# Patient Record
Sex: Female | Born: 1965 | Race: White | Hispanic: No | Marital: Single | Smoking: Never smoker
Health system: Southern US, Community
[De-identification: ages and names within clinical notes are randomized; demographics above are authoritative.]

## PROBLEM LIST (undated history)

## (undated) DIAGNOSIS — K219 Gastro-esophageal reflux disease without esophagitis: Secondary | ICD-10-CM

## (undated) DIAGNOSIS — E039 Hypothyroidism, unspecified: Secondary | ICD-10-CM

## (undated) HISTORY — PX: OVARY BIOPSY: SHX2143

## (undated) HISTORY — PX: TUBAL LIGATION: SHX77

---

## 2005-08-14 ENCOUNTER — Ambulatory Visit: Payer: Self-pay | Admitting: Obstetrics and Gynecology

## 2010-02-22 ENCOUNTER — Ambulatory Visit: Payer: Self-pay | Admitting: Otolaryngology

## 2010-02-23 ENCOUNTER — Emergency Department: Payer: Self-pay | Admitting: Emergency Medicine

## 2010-02-25 ENCOUNTER — Inpatient Hospital Stay: Payer: Self-pay | Admitting: Specialist

## 2010-08-01 ENCOUNTER — Ambulatory Visit: Payer: Self-pay | Admitting: Family Medicine

## 2013-05-27 ENCOUNTER — Ambulatory Visit: Payer: Self-pay | Admitting: Otolaryngology

## 2014-08-17 ENCOUNTER — Other Ambulatory Visit: Payer: Self-pay | Admitting: Physician Assistant

## 2014-08-17 DIAGNOSIS — E041 Nontoxic single thyroid nodule: Secondary | ICD-10-CM

## 2014-08-17 DIAGNOSIS — R1310 Dysphagia, unspecified: Secondary | ICD-10-CM

## 2014-08-20 ENCOUNTER — Ambulatory Visit
Admission: RE | Admit: 2014-08-20 | Discharge: 2014-08-20 | Disposition: A | Discharge status: Home / Self Care | Payer: 59 | Source: Ambulatory Visit | Attending: Physician Assistant | Admitting: Physician Assistant

## 2014-08-20 DIAGNOSIS — R1310 Dysphagia, unspecified: Secondary | ICD-10-CM

## 2014-08-20 DIAGNOSIS — E049 Nontoxic goiter, unspecified: Secondary | ICD-10-CM | POA: Insufficient documentation

## 2014-08-20 DIAGNOSIS — E041 Nontoxic single thyroid nodule: Secondary | ICD-10-CM

## 2014-09-06 ENCOUNTER — Encounter
Admission: RE | Admit: 2014-09-06 | Discharge: 2014-09-06 | Disposition: A | Discharge status: Home / Self Care | Payer: 59 | Source: Ambulatory Visit | Attending: Otolaryngology | Admitting: Otolaryngology

## 2014-09-06 DIAGNOSIS — E041 Nontoxic single thyroid nodule: Secondary | ICD-10-CM | POA: Diagnosis present

## 2014-09-06 DIAGNOSIS — E042 Nontoxic multinodular goiter: Secondary | ICD-10-CM | POA: Diagnosis not present

## 2014-09-06 DIAGNOSIS — H9190 Unspecified hearing loss, unspecified ear: Secondary | ICD-10-CM | POA: Diagnosis not present

## 2014-09-06 DIAGNOSIS — Z880 Allergy status to penicillin: Secondary | ICD-10-CM | POA: Diagnosis not present

## 2014-09-06 DIAGNOSIS — Z882 Allergy status to sulfonamides status: Secondary | ICD-10-CM | POA: Diagnosis not present

## 2014-09-06 HISTORY — DX: Gastro-esophageal reflux disease without esophagitis: K21.9

## 2014-09-06 HISTORY — DX: Hypothyroidism, unspecified: E03.9

## 2014-09-06 NOTE — Patient Instructions (Signed)
  Your procedure is scheduled on:09/09/14  6:00 am Report to Day Surgery.MEDICAL MALL TO SECOND FLOOR To find out your arrival time please call [phone] between 1PM - 3PM on Remember: Instructions that are not followed completely may result in serious medical risk, up to and including death, or upon the discretion of your surgeon and anesthesiologist your surgery may need to be rescheduled.    _x___ 1. Do not eat food or drink liquids after midnight. No gum chewing or hard candies.     __x__ 2. No Alcohol for 24 hours before or after surgery.   ____ 3. Bring all medications with you on the day of surgery if instructed.    __x__ 4. Notify your doctor if there is any change in your medical condition     (cold, fever, infections).     Do not wear jewelry, make-up, hairpins, clips or nail polish.  Do not wear lotions, powders, or perfumes. You may wear deodorant.  Do not shave 48 hours prior to surgery. Men may shave face and neck.  Do not bring valuables to the hospital.    [facility] responsible for any belongings or valuables.               Contacts, dentures or bridgework may not be worn into surgery.  Leave your suitcase in the car. After surgery it may be brought to your room.  For patients admitted to the hospital, discharge time is determined by your                treatment team.   Patients discharged the day of surgery will not be allowed to drive home.   Please read over the following fact sheets that you were given:   Surgical Site Infection Prevention   ____ Take these medicines the morning of surgery with A SIP OF WATER:    1.   2.   3.   4.  5.  6.  ____ Fleet Enema (as directed)   x____ Use CHG Soap as directed  ____ Use inhalers on the day of surgery  ____ Stop metformin 2 days prior to surgery    ____ Take 1/2 of usual insulin dose the night before surgery and none on the morning of surgery.   ____ Stop Coumadin/Plavix/aspirin on   ____  Stop Anti-inflammatories on    ____ Stop supplements until after surgery.    ____ Bring C-Pap to the hospital.

## 2014-09-06 NOTE — OR Nursing (Signed)
Interpreter for deaf requested

## 2014-09-09 ENCOUNTER — Encounter
Admission: AD | Disposition: A | Discharge status: Home / Self Care | Payer: Self-pay | Source: Ambulatory Visit | Attending: Otolaryngology

## 2014-09-09 ENCOUNTER — Ambulatory Visit: Payer: 59 | Admitting: Anesthesiology

## 2014-09-09 ENCOUNTER — Encounter: Payer: Self-pay | Admitting: *Deleted

## 2014-09-09 ENCOUNTER — Observation Stay
Admission: AD | Admit: 2014-09-09 | Discharge: 2014-09-09 | Disposition: A | Discharge status: Home / Self Care | Payer: 59 | Source: Ambulatory Visit | Attending: Otolaryngology | Admitting: Otolaryngology

## 2014-09-09 DIAGNOSIS — Z880 Allergy status to penicillin: Secondary | ICD-10-CM | POA: Insufficient documentation

## 2014-09-09 DIAGNOSIS — H9190 Unspecified hearing loss, unspecified ear: Secondary | ICD-10-CM | POA: Insufficient documentation

## 2014-09-09 DIAGNOSIS — Z882 Allergy status to sulfonamides status: Secondary | ICD-10-CM | POA: Insufficient documentation

## 2014-09-09 DIAGNOSIS — E042 Nontoxic multinodular goiter: Principal | ICD-10-CM | POA: Insufficient documentation

## 2014-09-09 DIAGNOSIS — E890 Postprocedural hypothyroidism: Secondary | ICD-10-CM

## 2014-09-09 HISTORY — PX: THYROIDECTOMY: SHX17

## 2014-09-09 LAB — CALCIUM
CALCIUM: 8.7 mg/dL — AB (ref 8.9–10.3)
Calcium: 8.8 mg/dL — ABNORMAL LOW (ref 8.9–10.3)

## 2014-09-09 LAB — ALBUMIN: ALBUMIN: 3.8 g/dL (ref 3.5–5.0)

## 2014-09-09 SURGERY — THYROIDECTOMY
Anesthesia: General | Wound class: Clean

## 2014-09-09 MED ORDER — PROPOFOL 10 MG/ML IV BOLUS
INTRAVENOUS | Status: DC | PRN
  Administered 2014-09-09: 120 mg via INTRAVENOUS

## 2014-09-09 MED ORDER — ACETAMINOPHEN 10 MG/ML IV SOLN
INTRAVENOUS | Status: DC | PRN
  Administered 2014-09-09: 1000 mg via INTRAVENOUS

## 2014-09-09 MED ORDER — BACITRACIN ZINC 500 UNIT/GM EX OINT
TOPICAL_OINTMENT | CUTANEOUS | Status: AC
  Filled 2014-09-09: qty 0.9

## 2014-09-09 MED ORDER — LIDOCAINE HCL (CARDIAC) 20 MG/ML IV SOLN
INTRAVENOUS | Status: DC | PRN
  Administered 2014-09-09 (×2): 100 mg via INTRAVENOUS

## 2014-09-09 MED ORDER — FENTANYL CITRATE (PF) 100 MCG/2ML IJ SOLN
25.0000 ug | INTRAMUSCULAR | Status: DC | PRN
  Administered 2014-09-09 (×2): 25 ug via INTRAVENOUS

## 2014-09-09 MED ORDER — DEXTROSE-NACL 5-0.45 % IV SOLN
INTRAVENOUS | Status: DC
  Administered 2014-09-09: 11:00:00 via INTRAVENOUS

## 2014-09-09 MED ORDER — ONDANSETRON HCL 4 MG/2ML IJ SOLN
4.0000 mg | Freq: Once | INTRAMUSCULAR | Status: AC | PRN
Start: 2014-09-09 — End: 2014-09-09
  Administered 2014-09-09: 4 mg via INTRAVENOUS

## 2014-09-09 MED ORDER — TRAMADOL HCL 50 MG PO TABS
50.0000 mg | ORAL_TABLET | Freq: Four times a day (QID) | ORAL | Status: DC | PRN
  Administered 2014-09-09: 50 mg via ORAL
  Filled 2014-09-09: qty 1

## 2014-09-09 MED ORDER — SUCCINYLCHOLINE CHLORIDE 20 MG/ML IJ SOLN
INTRAMUSCULAR | Status: DC | PRN
  Administered 2014-09-09: 100 mg via INTRAVENOUS

## 2014-09-09 MED ORDER — SODIUM CHLORIDE 0.9 % IR SOLN
Status: DC | PRN
  Administered 2014-09-09: 200 mL

## 2014-09-09 MED ORDER — ACETAMINOPHEN 10 MG/ML IV SOLN
INTRAVENOUS | Status: AC
Start: 2014-09-09 — End: 2014-09-09
  Filled 2014-09-09: qty 100

## 2014-09-09 MED ORDER — FENTANYL CITRATE (PF) 100 MCG/2ML IJ SOLN
INTRAMUSCULAR | Status: AC
  Administered 2014-09-09: 25 ug via INTRAVENOUS
  Filled 2014-09-09: qty 2

## 2014-09-09 MED ORDER — LEVOTHYROXINE SODIUM 100 MCG PO TABS: 100.0000 ug | ORAL_TABLET | Freq: Every day | ORAL | Status: AC

## 2014-09-09 MED ORDER — CALCIUM CARBONATE-VITAMIN D 500-200 MG-UNIT PO TABS
2.0000 | ORAL_TABLET | Freq: Three times a day (TID) | ORAL | Status: DC
  Administered 2014-09-09 (×2): 2 via ORAL
  Filled 2014-09-09 (×2): qty 2

## 2014-09-09 MED ORDER — CALCIUM CARBONATE-VITAMIN D 500-200 MG-UNIT PO TABS: 2.0000 | ORAL_TABLET | Freq: Three times a day (TID) | ORAL | Status: DC

## 2014-09-09 MED ORDER — MIDAZOLAM HCL 2 MG/2ML IJ SOLN
INTRAMUSCULAR | Status: DC | PRN
  Administered 2014-09-09: 1 mg via INTRAVENOUS

## 2014-09-09 MED ORDER — ONDANSETRON HCL 4 MG/2ML IJ SOLN: 4.0000 mg | Freq: Four times a day (QID) | INTRAMUSCULAR | Status: DC | PRN

## 2014-09-09 MED ORDER — PROMETHAZINE HCL 12.5 MG PO TABS: 12.5000 mg | ORAL_TABLET | Freq: Four times a day (QID) | ORAL | Status: DC | PRN

## 2014-09-09 MED ORDER — DEXAMETHASONE SODIUM PHOSPHATE 4 MG/ML IJ SOLN
INTRAMUSCULAR | Status: DC | PRN
  Administered 2014-09-09: 10 mg via INTRAVENOUS

## 2014-09-09 MED ORDER — FAMOTIDINE 20 MG PO TABS
20.0000 mg | ORAL_TABLET | Freq: Once | ORAL | Status: AC
  Administered 2014-09-09: 20 mg via ORAL

## 2014-09-09 MED ORDER — PROMETHAZINE HCL 25 MG/ML IJ SOLN
INTRAMUSCULAR | Status: AC
  Administered 2014-09-09: 6.25 mg via INTRAVENOUS
  Filled 2014-09-09: qty 1

## 2014-09-09 MED ORDER — ACETAMINOPHEN 325 MG PO TABS
650.0000 mg | ORAL_TABLET | ORAL | Status: DC | PRN
  Administered 2014-09-09: 650 mg via ORAL
  Filled 2014-09-09: qty 2

## 2014-09-09 MED ORDER — BACITRACIN ZINC 500 UNIT/GM EX OINT
TOPICAL_OINTMENT | CUTANEOUS | Status: DC | PRN
  Administered 2014-09-09: 1 via TOPICAL

## 2014-09-09 MED ORDER — SODIUM CHLORIDE 0.9 % IJ SOLN
INTRAMUSCULAR | Status: AC
  Filled 2014-09-09: qty 10

## 2014-09-09 MED ORDER — ONDANSETRON HCL 4 MG/2ML IJ SOLN
INTRAMUSCULAR | Status: AC
  Administered 2014-09-09: 4 mg via INTRAVENOUS
  Filled 2014-09-09: qty 2

## 2014-09-09 MED ORDER — HYDROMORPHONE HCL 1 MG/ML IJ SOLN: 0.2500 mg | INTRAMUSCULAR | Status: DC | PRN

## 2014-09-09 MED ORDER — LEVOTHYROXINE SODIUM 50 MCG PO TABS: 100.0000 ug | ORAL_TABLET | Freq: Every day | ORAL | Status: DC

## 2014-09-09 MED ORDER — TRAMADOL HCL 50 MG PO TABS: 50.0000 mg | ORAL_TABLET | Freq: Four times a day (QID) | ORAL | Status: DC | PRN

## 2014-09-09 MED ORDER — FAMOTIDINE 20 MG PO TABS
ORAL_TABLET | ORAL | Status: AC
  Administered 2014-09-09: 20 mg via ORAL
  Filled 2014-09-09: qty 1

## 2014-09-09 MED ORDER — BUPIVACAINE-EPINEPHRINE (PF) 0.25% -1:200000 IJ SOLN
INTRAMUSCULAR | Status: AC
  Filled 2014-09-09: qty 30

## 2014-09-09 MED ORDER — LACTATED RINGERS IV SOLN
INTRAVENOUS | Status: DC
  Administered 2014-09-09: 75 mL/h via INTRAVENOUS

## 2014-09-09 MED ORDER — BUPIVACAINE-EPINEPHRINE (PF) 0.25% -1:200000 IJ SOLN
INTRAMUSCULAR | Status: DC | PRN
  Administered 2014-09-09: 6.5 mL via PERINEURAL

## 2014-09-09 MED ORDER — PROMETHAZINE HCL 25 MG/ML IJ SOLN
6.2500 mg | Freq: Once | INTRAMUSCULAR | Status: AC
  Administered 2014-09-09: 6.25 mg via INTRAVENOUS

## 2014-09-09 MED ORDER — ONDANSETRON HCL 4 MG/2ML IJ SOLN
INTRAMUSCULAR | Status: DC | PRN
  Administered 2014-09-09: 4 mg via INTRAVENOUS

## 2014-09-09 MED ORDER — HYDROMORPHONE HCL 1 MG/ML IJ SOLN
INTRAMUSCULAR | Status: DC | PRN
  Administered 2014-09-09: 1 mg via INTRAVENOUS

## 2014-09-09 MED ORDER — REMIFENTANIL HCL 1 MG IV SOLR
INTRAVENOUS | Status: DC | PRN
  Administered 2014-09-09: .08 ug/kg/min via INTRAVENOUS

## 2014-09-09 SURGICAL SUPPLY — 37 items
BLADE SURG 15 STRL LF DISP TIS (BLADE) ×1 IMPLANT
BLADE SURG 15 STRL SS (BLADE) ×2
CANISTER SUCT 1200ML W/VALVE (MISCELLANEOUS) ×3 IMPLANT
CLOSURE WOUND 1/4X4 (GAUZE/BANDAGES/DRESSINGS) ×1
CORD BIP STRL DISP 12FT (MISCELLANEOUS) ×3 IMPLANT
DECANTER SPIKE VIAL GLASS SM (MISCELLANEOUS) ×3 IMPLANT
DRAIN TLS ROUND 10FR (DRAIN) IMPLANT
DRAPE MAG INST 16X20 L/F (DRAPES) ×3 IMPLANT
DRSG TEGADERM 2-3/8X2-3/4 SM (GAUZE/BANDAGES/DRESSINGS) ×6 IMPLANT
ELECT LARYNGEAL 6/7 (MISCELLANEOUS) ×3
ELECT LARYNGEAL 8/9 (MISCELLANEOUS)
ELECTRODE LARYNGEAL 6/7 (MISCELLANEOUS) ×1 IMPLANT
ELECTRODE LARYNGEAL 8/9 (MISCELLANEOUS) IMPLANT
FORCEPS JEWEL BIP 4-3/4 STR (INSTRUMENTS) ×3 IMPLANT
GLOVE BIO SURGEON STRL SZ7.5 (GLOVE) ×18 IMPLANT
GOWN STRL REUS W/ TWL LRG LVL3 (GOWN DISPOSABLE) ×4 IMPLANT
GOWN STRL REUS W/TWL LRG LVL3 (GOWN DISPOSABLE) ×8
HARMONIC SCALPEL FOCUS (MISCELLANEOUS) ×3 IMPLANT
HEMOSTAT SURGICEL 2X3 (HEMOSTASIS) ×3 IMPLANT
HOOK STAY 5M SHARP BLUNT 3316- (MISCELLANEOUS) IMPLANT
KIT RM TURNOVER STRD PROC AR (KITS) ×3 IMPLANT
LABEL OR SOLS (LABEL) ×3 IMPLANT
LIQUID BAND (GAUZE/BANDAGES/DRESSINGS) IMPLANT
NS IRRIG 500ML POUR BTL (IV SOLUTION) ×3 IMPLANT
PACK HEAD/NECK (MISCELLANEOUS) ×3 IMPLANT
PAD GROUND ADULT SPLIT (MISCELLANEOUS) ×3 IMPLANT
PROBE NEUROSIGN BIPOL (MISCELLANEOUS) ×1 IMPLANT
PROBE NEUROSIGN BIPOLAR (MISCELLANEOUS) ×2
SPONGE KITTNER 5P (MISCELLANEOUS) ×9 IMPLANT
SPONGE XRAY 4X4 16PLY STRL (MISCELLANEOUS) ×3 IMPLANT
STRIP CLOSURE SKIN 1/4X4 (GAUZE/BANDAGES/DRESSINGS) ×2 IMPLANT
SUT PROLENE 6 0 P 1 18 (SUTURE) ×6 IMPLANT
SUT SILK 2 0 (SUTURE)
SUT SILK 2 0 SH (SUTURE) IMPLANT
SUT SILK 2-0 18XBRD TIE 12 (SUTURE) IMPLANT
SUT VIC AB 4-0 RB1 18 (SUTURE) ×3 IMPLANT
SYSTEM CHEST DRAIN TLS 7FR (DRAIN) ×3 IMPLANT

## 2014-09-09 NOTE — H&P (Signed)
..  History and Physical paper copy reviewed and updated date of procedure and will be scanned into system.  

## 2014-09-09 NOTE — Anesthesia Preprocedure Evaluation (Addendum)
Anesthesia Evaluation  Patient identified by MRN, date of birth, ID band Patient awake    Reviewed: Allergy & Precautions, NPO status , Patient's Chart, lab work & pertinent test results  History of Anesthesia Complications Negative for: history of anesthetic complications  Airway Mallampati: II  TM Distance: >3 FB Neck ROM: Full    Dental no notable dental hx.    Pulmonary neg pulmonary ROS,  breath sounds clear to auscultation  Pulmonary exam normal       Cardiovascular Exercise Tolerance: Good negative cardio ROS Normal cardiovascular examRhythm:Regular Rate:Normal     Neuro/Psych deaf negative psych ROS   GI/Hepatic Neg liver ROS, GERD-  Medicated and Poorly Controlled,  Endo/Other  Hypothyroidism   Renal/GU negative Renal ROS  negative genitourinary   Musculoskeletal negative musculoskeletal ROS (+)   Abdominal   Peds negative pediatric ROS (+)  Hematology negative hematology ROS (+)   Anesthesia Other Findings   Reproductive/Obstetrics negative OB ROS                            Anesthesia Physical Anesthesia Plan  ASA: II  Anesthesia Plan: General   Post-op Pain Management:    Induction: Intravenous  Airway Management Planned: Oral ETT  Additional Equipment:   Intra-op Plan:   Post-operative Plan: Extubation in OR  Informed Consent: I have reviewed the patients History and Physical, chart, labs and discussed the procedure including the risks, benefits and alternatives for the proposed anesthesia with the patient or authorized representative who has indicated his/her understanding and acceptance.   Dental advisory given  Plan Discussed with: Surgeon and CRNA  Anesthesia Plan Comments:         Anesthesia Quick Evaluation

## 2014-09-09 NOTE — Transfer of Care (Signed)
Immediate Anesthesia Transfer of Care Note  Patient: Dawn BaltimoreCatherine L Frontera  Procedure(s) Performed: Procedure(s): TOTAL THYROIDECTOMY, left superior parathyroid gland reimplanted (N/A)  Patient Location: PACU  Anesthesia Type:General  Level of Consciousness: awake, alert , oriented and patient cooperative  Airway & Oxygen Therapy: Patient Spontanous Breathing and Patient connected to nasal cannula oxygen  Post-op Assessment: Report given to RN and Post -op Vital signs reviewed and stable  Post vital signs: Reviewed and stable  Last Vitals:  Filed Vitals:   09/09/14 1014  BP: 121/71  Pulse:   Temp: 37.6 C  Resp: 20    Complications: No apparent anesthesia complications

## 2014-09-09 NOTE — Progress Notes (Signed)
..   09/09/2014 5:23 PM  Shirley FriarMason, Jamyrah [id number]  Post-Op Day 0    Temp:  [97.5 F (36.4 C)-99.7 F (37.6 C)] 97.7 F (36.5 C) (06/30 1551) Pulse Rate:  [58-116] 58 (06/30 1551) Resp:  [12-20] 16 (06/30 1551) BP: (97-127)/(54-77) 97/54 mmHg (06/30 1551) SpO2:  [93 %-100 %] 98 % (06/30 1551) Weight:  [75.297 kg (166 lb)] 75.297 kg (166 lb) (06/30 [id number]),     Intake/Output Summary (Last 24 hours) at 09/09/14 1723 Last data filed at 09/09/14 1551  Gross per 24 hour  Intake   1240 ml  Output     10 ml  Net   1230 ml    Results for orders placed or performed during the hospital encounter of 09/09/14 (from the past 24 hour(s))  Calcium     Status: Abnormal   Collection Time: 09/09/14 10:54 AM  Result Value Ref Range   Calcium 8.8 (L) 8.9 - 10.3 mg/dL  Albumin     Status: None   Collection Time: 09/09/14 10:54 AM  Result Value Ref Range   Albumin 3.8 3.5 - 5.0 g/dL  Calcium     Status: Abnormal   Collection Time: 09/09/14  2:54 PM  Result Value Ref Range   Calcium 8.7 (L) 8.9 - 10.3 mg/dL    SUBJECTIVE:  No acute events.  Reports able to tolerate diet.  No numbness or tingling.  Expected sore throat.  No respiratory issues.  OBJECTIVE:   Gen- NAD, alert and oriented Ears- hard of hearing at baseline, normal external ears Nose- clear anteriorly, nasal cannula removed Oc/op- neg Neck- incision c/d/i, drain in place, no hematoma or seroma Neuro- neg Chovstek's  IMPRESSION:  S/p Total thyroidectomy  PLAN:  Calciums stable and correct with albumin to normal range.  Patient's pain controlled and ambulating and tolerating PO.  Will discharge home and see back in my office tomorrow morning at 8:15.  Carlena Ruybal 09/09/2014, 5:23 PM

## 2014-09-09 NOTE — Anesthesia Postprocedure Evaluation (Signed)
  Anesthesia Post-op Note  Patient: Dawn Randall  Procedure(s) Performed: Procedure(s): TOTAL THYROIDECTOMY, left superior parathyroid gland reimplanted (N/A)  Anesthesia type:General  Patient location: PACU  Post pain: Pain level controlled  Post assessment: Post-op Vital signs reviewed, Patient's Cardiovascular Status Stable, Respiratory Function Stable, Patent Airway and No signs of Nausea or vomiting  Post vital signs: Reviewed and stable  Last Vitals:  Filed Vitals:   09/09/14 1045  BP: 126/71  Pulse: 88  Temp:   Resp: 20    Level of consciousness: awake, alert  and patient cooperative  Complications: No apparent anesthesia complications

## 2014-09-09 NOTE — Op Note (Addendum)
..09/09/2014  10:10 AM    Shirley Friar  [id number]   Pre-Op Dx: THYROID NODULES  Post-op Dx: SAME  Pre-Op Dx: Thyroid Nodules increasing in size  Post-op Dx: same  Proc: Minimally Invasive Total Thyroidectomy with Laryngeal Nerve Monitoring   Surg: Lalanya Rufener   Assistant: Marion Downer  Anes: GOT  EBL: 10 cc  Comp: none  Findings: Right superior and inferior parathyroid glands identified and preserved, left superior parathyroid identified and reimplanted, Left inferior parathyroid gland identified and preserved. Bilateral Recurrent laryngeal nerves identified and preserved. Multinodular thyroid.  Procedure: After the patient was identified in holding and the consent and H&P was reviewed, the patient was brought to the operating room and place in a supine position. At this time, the patient was marked along a natural skin crease in the lower neck after general endotracheal anesthesia was induced. Visualization of the nerve monitoring electrode on the endotracheal tube was made with a Glyde Laryngoscope. The patient at this time was injected with 8ccs of 0.25% Marcaine with 1:200,000 epinephrine. At this time, the patient was prepped and draped in a sterile fashion.  An anterior neck incision with a 15 blade was made and dissection through the subcutaneous tissues was preformed with Bovie electrocautery. The platysma was divided and the median raphe was sharply and bluntly dissected until the anterior isthmus of the thyroid was encountered. At this time, the median raphe was opened until the sternal notch to the thyroid notch. Anterior neck veins were divided with Harmonic Scalpel.   Attention at this time was directed to the patient's left thyroid lobe. The sternohyoid and sternothyroid muscles were separated from the left thyroid lobe until the carotid artery and sheath were encountered. The lateral border of the thyroid was bluntly and sharply  dissected inferior and superior. The lateral border of the superior thyroid vessels were identified and pedicled. Medially between the larynx and the superior thyroid lobe, Joel's space as bluntly entered resulting in a pedicled superior thyroid vessel bundle. This was ligated With Harmonic scalpel. At this time, the superior attachments were divided and the thyroid lobe was able to be brought out of the wound and Berry's ligament was pedicled. Careful dissection revealed the superior and inferior parathyroid glands as well as the recurrent laryngeal nerve coursing deep to the inferior thyroid artery. This was identified and protected and the remaining attachments of the left hemithyroid were divided.  Attention at this time was directed to the patient's right thyroid lobe. The sternohyoid and sternothyroid muscles were separated from the right thyroid lobe until the carotid artery and sheath were encountered. The lateral border of the thyroid was bluntly and sharply dissected inferior and superior. This demonstrated multiple lymph nodes medially in the area of the pyramidal lobe. The lateral border of the superior thyroid vessels were identified and pedicled. Medially between the larynx and the superior thyroid lobe, Joel's space as bluntly entered resulting in a pedicled superior thyroid vessel bundle. This was ligated With Harmonic scalpel. At this time, the superior attachments were divided and the thyroid lobe was able to be brought out of the wound and Berry's ligament was pedicled. Careful dissection revealed the superior and inferior parathyroid glands as well as the recurrent laryngeal nerve coursing deep to the inferior thyroid artery. This was identified and protected and the remaining attachments of the right hemithyroid were divided.  At this time, all remaining attachments of the thyroid gland to the anterior trachea were divided.  A large pyramidal lobe was dissected  away from  the midline larynx.   Both recurrent laryngeal nerves were noted to be intact and stimulated well. The left parathyroid glands were both evaluated and the inferior was noted to be of good color, but the superior parathyroid gland was dusky and thus was minced and reimplanted into the patient's left strap muscle and marked with a prolene stitch. The right superior and inferior parathyroid gland was identified and noted to be of normal color. No parathyroid gland was noted in the specimen.  At this time the patient's neck was copiously irrigated with sterile saline.  Meticulous hemostasis was ensured.  Surgicele was placed with in the thyroid bed bilaterally.  A small TLS drain was place with in the thyroid beds and taken out of the left neck adjacent to the incision.  The strap muscles were reapproximated using a sinlge 4.0 vicryl with a figure of 8 stitch.  The sub dermis was reapproximated using subdermal 4.0 vicryls and then the skin was closed with 6.0 prolene in a locking-running fashion and topped with a bacitracin.  At this time, care of the patient was transferred to anesthesia were the patient was taken to PACU in good condition.   Dispo: To PACU in good condition  Plan: Admit to floor. Follow calcium levels. Begin synthroid.  Avarey Yaeger Floyd Lusignan  09/09/2014 10:10 AM

## 2014-09-15 LAB — SURGICAL PATHOLOGY

## 2014-12-30 ENCOUNTER — Encounter: Payer: Self-pay | Admitting: Orthopedic Surgery

## 2014-12-30 ENCOUNTER — Ambulatory Visit (INDEPENDENT_AMBULATORY_CARE_PROVIDER_SITE_OTHER): Payer: 59

## 2014-12-30 ENCOUNTER — Ambulatory Visit (INDEPENDENT_AMBULATORY_CARE_PROVIDER_SITE_OTHER): Payer: 59 | Admitting: Orthopedic Surgery

## 2014-12-30 VITALS — BP 124/77 | Ht 64.0 in | Wt 154.0 lb

## 2014-12-30 DIAGNOSIS — M75102 Unspecified rotator cuff tear or rupture of left shoulder, not specified as traumatic: Secondary | ICD-10-CM | POA: Diagnosis not present

## 2014-12-30 DIAGNOSIS — M25512 Pain in left shoulder: Secondary | ICD-10-CM

## 2014-12-30 NOTE — Patient Instructions (Signed)
We will schedule MRI for you and call your daughter with the appt and follow up appt

## 2014-12-30 NOTE — Progress Notes (Signed)
Patient ID: Dawn Randall, female   DOB: 04/28/1965, 49 y.o.   MRN: [id number]  Chief Complaint  Patient presents with  . Shoulder Pain    left shoulder pain x 6 months    HPI Dawn Randall is a 49 y.o. female.   She presents for evaluation of 2 shoulder injuries in the last 6 months. She was remodeling her house reached above her head felt a significant pain in the left deltoid area which lasted for several weeks. That injury was in April 2016 and then in September she reached above her head again felt more intense pain and since that time she has not been able to lift her arm over her head or reach behind her back without severe pain in fact she can only lift her arm or proximally 90 in the scapular plane.  Her pain is now constant 6 out of 10 and is best described as a sharp throbbing aching sensation in the left shoulder. This is associated with swelling stiffness and occasional numbness and tingling. She's been on naproxen since April without relief   Review of systems tingling in the left shoulder the remaining 13 systems were reviewed and were normal  Review of Systems Review of Systems  Past Medical History  Diagnosis Date  . GERD (gastroesophageal reflux disease)   . Hypothyroidism     Past Surgical History  Procedure Laterality Date  . Cesarean section    . Tubal ligation    . Ovary biopsy    . Thyroidectomy N/A 09/09/2014    Procedure: TOTAL THYROIDECTOMY, left superior parathyroid gland reimplanted;  Surgeon: Bud Face, MD;  Location: [facility] ORS;  Service: ENT;  Laterality: N/A;    No family history on file.  Social History Social History  Substance Use Topics  . Smoking status: Never Smoker   . Smokeless tobacco: Never Used  . Alcohol Use: Yes    Allergies  Allergen Reactions  . Sulfa Antibiotics Anaphylaxis  . Adhesive [Tape] Hives  . Codeine Hives  . Morphine And Related Nausea And Vomiting  . Penicillins Hives    Current Outpatient  Prescriptions  Medication Sig Dispense Refill  . calcium-vitamin D (OSCAL WITH D) 500-200 MG-UNIT per tablet Take 2 tablets by mouth 3 (three) times daily. For 1 week.  Then take 2 tablets by mouth 2 times a day for 1 week, then take 2 tablets by mouth one time a day for 1 week. 120 tablet 0  . levothyroxine (SYNTHROID, LEVOTHROID) 100 MCG tablet Take 1 tablet (100 mcg total) by mouth daily before breakfast. 30 tablet 11   No current facility-administered medications for this visit.       Physical Exam Physical Exam Blood pressure 124/77, height  (1.626 m), weight 154 lb (69.854 kg). Patient is awake alert and oriented 3. She is definitely have an interpreter. Her mood is pleasant. Her general appearance is normal. Exam is without assistive devices. She's neurovascularly intact in the left upper extremity skin of the left arm is normal she has no axillary lymph nodes and or clavicular lymph nodes are negative as well  We noticed normal right shoulder range of motion stability and strength and is no tenderness or swelling Left Shoulder Exam   Tenderness  The patient is experiencing tenderness in the acromion and deltoid and rotator interval .  Range of Motion  Active Abduction:  70 Passive Abduction:                    100 Extension:                                  30 External Rotation:                      40  Muscle Strength  Abduction:            3/5 External Rotation: 5/5 Supraspinatus:     3/5 Subscapularis:     5/5 Biceps:                 5/5  Tests  Impingement:   Positive Hawkins:          Positive Cross Arm:      Positive Drop Arm:        Positive Apprehension: Negative Sulcus:            Negative    I ordered plain film she had a type I acromion and a normal glenohumeral joint  Impression torn rotator cuff left shoulder  Recommend MRI to prepare for surgical intervention patient aware.

## 2015-01-05 ENCOUNTER — Ambulatory Visit (HOSPITAL_COMMUNITY)
Admission: RE | Admit: 2015-01-05 | Discharge: 2015-01-05 | Disposition: A | Discharge status: Home / Self Care | Payer: 59 | Source: Ambulatory Visit | Attending: Orthopedic Surgery | Admitting: Orthopedic Surgery

## 2015-01-05 DIAGNOSIS — M25512 Pain in left shoulder: Secondary | ICD-10-CM | POA: Insufficient documentation

## 2015-01-05 DIAGNOSIS — R937 Abnormal findings on diagnostic imaging of other parts of musculoskeletal system: Secondary | ICD-10-CM | POA: Diagnosis not present

## 2015-01-05 DIAGNOSIS — M75102 Unspecified rotator cuff tear or rupture of left shoulder, not specified as traumatic: Secondary | ICD-10-CM

## 2015-01-05 DIAGNOSIS — M25612 Stiffness of left shoulder, not elsewhere classified: Secondary | ICD-10-CM | POA: Insufficient documentation

## 2015-01-10 ENCOUNTER — Telehealth: Payer: Self-pay | Admitting: Orthopedic Surgery

## 2015-01-10 NOTE — Telephone Encounter (Signed)
MRI in 2016 October looking for rotator cuff tear  Looks like adhesive capsulitis  Recommend physical therapy  Message left with Dawn [name] who is on the contact list to call the office back to acknowledge message received and SO THAT we can order therapy for her.   IMPRESSION: 1. Intermediate signal material effacing the normal subcoracoid fat as can be seen with adhesive capsulitis. Mild increased signal in the posterior band of the inferior glenohumeral ligament which is attenuated at the humeral insertion concerning for remote injury. 2. No rotator cuff tear or labral tear of the left shoulder.     Electronically Signed   By: Elige KoHetal  Patel   On: 01/06/2015 08:32

## 2015-01-11 ENCOUNTER — Telehealth: Payer: Self-pay | Admitting: Orthopedic Surgery

## 2015-01-11 NOTE — Telephone Encounter (Signed)
Routing to Dr Harrison for review 

## 2015-01-11 NOTE — Telephone Encounter (Signed)
Patient (daughter,designated contact) called back in response to receiving Dr Mort SawyersHarrison's message about results of mother's MRI.  States physical therapy is indicated, and if so, prefers [place] location, [facility].  Please call and advise at her cell #[phone].  Patient also has MyChart account.

## 2015-01-12 ENCOUNTER — Other Ambulatory Visit: Payer: Self-pay | Admitting: *Deleted

## 2015-01-12 DIAGNOSIS — M75102 Unspecified rotator cuff tear or rupture of left shoulder, not specified as traumatic: Secondary | ICD-10-CM

## 2015-01-12 NOTE — Telephone Encounter (Signed)
SCHEDULE PT  READ PROGRESS NOTE FOR DX   3 X A WEEK X 6 WEEKS

## 2015-01-12 NOTE — Telephone Encounter (Signed)
Order sent as requested

## 2015-01-21 ENCOUNTER — Other Ambulatory Visit (HOSPITAL_COMMUNITY): Payer: 59

## 2020-04-29 ENCOUNTER — Inpatient Hospital Stay
Admission: RE | Admit: 2020-04-29 | Discharge: 2020-04-29 | Disposition: A | Discharge status: Home / Self Care | Payer: Self-pay | Source: Ambulatory Visit | Attending: *Deleted | Admitting: *Deleted

## 2020-04-29 ENCOUNTER — Other Ambulatory Visit: Payer: Self-pay | Admitting: *Deleted

## 2020-04-29 DIAGNOSIS — Z1231 Encounter for screening mammogram for malignant neoplasm of breast: Secondary | ICD-10-CM

## 2020-05-04 ENCOUNTER — Other Ambulatory Visit: Payer: Self-pay | Admitting: Family Medicine

## 2020-05-04 DIAGNOSIS — N6310 Unspecified lump in the right breast, unspecified quadrant: Secondary | ICD-10-CM

## 2020-05-13 ENCOUNTER — Other Ambulatory Visit: Payer: Self-pay

## 2020-05-13 ENCOUNTER — Ambulatory Visit
Admission: RE | Admit: 2020-05-13 | Discharge: 2020-05-13 | Disposition: A | Discharge status: Home / Self Care | Payer: 59 | Source: Ambulatory Visit | Attending: Family Medicine | Admitting: Family Medicine

## 2020-05-13 DIAGNOSIS — N6310 Unspecified lump in the right breast, unspecified quadrant: Secondary | ICD-10-CM | POA: Insufficient documentation

## 2021-05-26 ENCOUNTER — Other Ambulatory Visit: Payer: Self-pay | Admitting: Physician Assistant

## 2021-05-26 DIAGNOSIS — R200 Anesthesia of skin: Secondary | ICD-10-CM

## 2021-05-26 DIAGNOSIS — R202 Paresthesia of skin: Secondary | ICD-10-CM

## 2021-06-06 ENCOUNTER — Other Ambulatory Visit: Payer: Self-pay

## 2021-06-06 ENCOUNTER — Ambulatory Visit
Admission: RE | Admit: 2021-06-06 | Discharge: 2021-06-06 | Disposition: A | Discharge status: Home / Self Care | Payer: 59 | Source: Ambulatory Visit | Attending: Physician Assistant | Admitting: Physician Assistant

## 2021-06-06 DIAGNOSIS — R202 Paresthesia of skin: Secondary | ICD-10-CM

## 2021-06-06 DIAGNOSIS — R200 Anesthesia of skin: Secondary | ICD-10-CM

## 2021-06-06 MED ORDER — GADOBENATE DIMEGLUMINE 529 MG/ML IV SOLN
14.0000 mL | Freq: Once | INTRAVENOUS | Status: AC | PRN
  Administered 2021-06-06: 14 mL via INTRAVENOUS

## 2021-07-18 ENCOUNTER — Other Ambulatory Visit: Payer: Self-pay | Admitting: Physician Assistant

## 2021-07-18 DIAGNOSIS — R200 Anesthesia of skin: Secondary | ICD-10-CM

## 2021-07-18 DIAGNOSIS — G8929 Other chronic pain: Secondary | ICD-10-CM

## 2021-09-01 ENCOUNTER — Other Ambulatory Visit: Payer: Self-pay | Admitting: Orthopedic Surgery

## 2021-09-01 DIAGNOSIS — G8929 Other chronic pain: Secondary | ICD-10-CM

## 2021-09-14 ENCOUNTER — Ambulatory Visit
Admission: RE | Admit: 2021-09-14 | Discharge: 2021-09-14 | Disposition: A | Discharge status: Home / Self Care | Payer: 59 | Source: Ambulatory Visit | Attending: Orthopedic Surgery | Admitting: Orthopedic Surgery

## 2021-09-14 DIAGNOSIS — G8929 Other chronic pain: Secondary | ICD-10-CM | POA: Diagnosis present

## 2021-09-14 DIAGNOSIS — M25561 Pain in right knee: Secondary | ICD-10-CM | POA: Diagnosis present

## 2021-09-20 NOTE — Progress Notes (Unsigned)
Referring Physician:  No referring provider defined for this encounter.  Primary Physician:  Phyllis Ginger (Inactive)  History of Present Illness: 09/21/2021 [name] is here today with a chief complaint of pain in her right buttock and down the back of her right leg. She also reports numbness in her right calf and heel. She has noticed constipation and decreased urination for the past year.  She has been having worsening symptoms over the past 3 years.  She describes pain on her right leg as well as some numbness.  She has weakness in her right leg.  She has fallen.    Conservative measures:  Physical therapy: has not participated Multimodal medical therapy including regular antiinflammatories: lyrica, gabapentin, nortriptyline  Injections: has not received epidural steroid injections  Past Surgery: denies  Dawn Randall has no symptoms of cervical myelopathy.  The symptoms are causing a significant impact on the patient's life.   Review of Systems:  A 10 point review of systems is negative, except for the pertinent positives and negatives detailed in the HPI.  Past Medical History: Past Medical History:  Diagnosis Date   GERD (gastroesophageal reflux disease)    Hypothyroidism     Past Surgical History: Past Surgical History:  Procedure Laterality Date   CESAREAN SECTION     OVARY BIOPSY     THYROIDECTOMY N/A 09/09/2014   Procedure: TOTAL THYROIDECTOMY, left superior parathyroid gland reimplanted;  Surgeon: Bud Face, MD;  Location: [facility] ORS;  Service: ENT;  Laterality: N/A;   TUBAL LIGATION      Allergies: Allergies as of 09/21/2021 - Review Complete 12/30/2014  Allergen Reaction Noted   Sulfa antibiotics Anaphylaxis, Other (See Comments), and Shortness Of Breath 03/29/2010   Tape Hives, Rash, and Other (See Comments) 03/29/2010   Codeine Hives and Itching 03/29/2010   Cyclobenzaprine Other (See Comments) 05/03/2021   Morphine  Itching 03/29/2010   Morphine and related Nausea And Vomiting 09/06/2014   Penicillins Hives and Other (See Comments) 03/29/2010   Zoster vaccine live Other (See Comments) 05/03/2021    Medications: Current Meds  Medication Sig   pregabalin (LYRICA) 50 MG capsule Take 100 mg by mouth at bedtime.    Social History: Social History   Tobacco Use   Smoking status: Never   Smokeless tobacco: Never  Substance Use Topics   Alcohol use: Yes   Drug use: No    Family Medical History: No family history on file.  Physical Examination: Vitals:   09/21/21 1352  BP: 120/70  Pulse: 88    General: Patient is well developed, well nourished, calm, collected, and in no apparent distress. Attention to examination is appropriate.  Psychiatric: Patient is non-anxious.  Head:  Pupils equal, round, and reactive to light.  ENT:  Oral mucosa appears well hydrated.  Neck:   Supple.  Full range of motion.  Respiratory: Patient is breathing without any difficulty.  Extremities: No edema.  Vascular: Palpable dorsal pedal pulses.  Skin:   On exposed skin, there are no abnormal skin lesions.  NEUROLOGICAL:     Awake, alert, oriented to person, place, and time.  Speech is understandable but abnormal due to deafness. Fund of knowledge is appropriate.   Cranial Nerves: Pupils equal round and reactive to light.  Facial tone is symmetric.  Facial sensation is symmetric. Shoulder shrug is symmetric. Tongue protrusion is midline.  There is no pronator drift.   Strength: Side Biceps Triceps Deltoid Interossei Grip Wrist Ext. Wrist  Flex.  R 5 5 5 5 5 5 5   L 5 5 5 5 5 5 5    Side Iliopsoas Quads Hamstring PF DF EHL  R 4 4 4  4+ 4+ 4+  L 5 5 5 5 5 5    Reflexes are 1+ and symmetric at the biceps, triceps, brachioradialis, patella and achilles.   Hoffman's is absent.  Clonus is not present.  Toes are down-going.  Bilateral upper and lower extremity sensation is intact to light touch.    No  evidence of dysmetria noted.  Gait is abnormal due to RLE weakness.      Medical Decision Making  Imaging: MRI of the lumbar spine on Aug 02, 2021 shows 2.1 x 0.6 x 0.5 mm lesion along the right aspect of the conus medullaris suggestive of a lipoma.  I have personally reviewed the images and agree with the above interpretation.  Assessment and Plan: [name] is a pleasant 56 y.o. 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  She does have some right lower extremity weakness which could be due to this.  I described to her and her daughter that we are unable to take care of this lesion at [facility], so I would refer her to the local tertiary care center for evaluation and treatment.   I spent a total of 30 minutes in face-to-face and non-face-to-face activities related to this patient's care today.  Thank you for involving me in the care of this patient.   This note was partially dictated using voice recognition software, so please excuse any errors that were not corrected.   Hasset Chaviano K. Aug 04, 2021 MD, [facility]

## 2021-09-21 ENCOUNTER — Ambulatory Visit: Payer: 59 | Admitting: Neurosurgery

## 2021-09-21 ENCOUNTER — Encounter: Payer: Self-pay | Admitting: Neurosurgery

## 2021-09-21 VITALS — BP 120/70 | HR 88 | Ht 64.0 in | Wt 142.2 lb

## 2021-09-21 DIAGNOSIS — M898X8 Other specified disorders of bone, other site: Secondary | ICD-10-CM

## 2022-01-22 ENCOUNTER — Ambulatory Visit: Payer: 59 | Admitting: Certified Registered"

## 2022-01-22 ENCOUNTER — Ambulatory Visit
Admission: RE | Admit: 2022-01-22 | Discharge: 2022-01-22 | Disposition: A | Discharge status: Home / Self Care | Payer: 59 | Attending: Gastroenterology | Admitting: Gastroenterology

## 2022-01-22 ENCOUNTER — Encounter
Admission: RE | Disposition: A | Discharge status: Home / Self Care | Payer: Self-pay | Source: Home / Self Care | Attending: Gastroenterology

## 2022-01-22 DIAGNOSIS — Z83711 Family history of hyperplastic colon polyps: Secondary | ICD-10-CM | POA: Diagnosis not present

## 2022-01-22 DIAGNOSIS — E039 Hypothyroidism, unspecified: Secondary | ICD-10-CM | POA: Insufficient documentation

## 2022-01-22 DIAGNOSIS — Z1211 Encounter for screening for malignant neoplasm of colon: Secondary | ICD-10-CM | POA: Insufficient documentation

## 2022-01-22 DIAGNOSIS — K640 First degree hemorrhoids: Secondary | ICD-10-CM | POA: Diagnosis not present

## 2022-01-22 DIAGNOSIS — K5730 Diverticulosis of large intestine without perforation or abscess without bleeding: Secondary | ICD-10-CM | POA: Insufficient documentation

## 2022-01-22 DIAGNOSIS — K648 Other hemorrhoids: Secondary | ICD-10-CM | POA: Diagnosis not present

## 2022-01-22 HISTORY — PX: COLONOSCOPY WITH PROPOFOL: SHX5780

## 2022-01-22 SURGERY — COLONOSCOPY WITH PROPOFOL
Anesthesia: General

## 2022-01-22 MED ORDER — SODIUM CHLORIDE 0.9 % IV SOLN
INTRAVENOUS | Status: DC
  Administered 2022-01-22: 20 mL/h via INTRAVENOUS

## 2022-01-22 MED ORDER — STERILE WATER FOR IRRIGATION IR SOLN
Status: DC | PRN
  Administered 2022-01-22: 100 mL

## 2022-01-22 MED ORDER — MIDAZOLAM HCL 2 MG/2ML IJ SOLN
INTRAMUSCULAR | Status: AC
  Filled 2022-01-22: qty 2

## 2022-01-22 MED ORDER — SODIUM CHLORIDE 0.9 % IV SOLN: INTRAVENOUS | Status: DC | PRN

## 2022-01-22 MED ORDER — KETAMINE HCL 50 MG/5ML IJ SOSY
PREFILLED_SYRINGE | INTRAMUSCULAR | Status: AC
  Filled 2022-01-22: qty 5

## 2022-01-22 MED ORDER — MIDAZOLAM HCL 2 MG/2ML IJ SOLN
INTRAMUSCULAR | Status: DC | PRN
Start: 2022-01-22 — End: 2022-01-22
  Administered 2022-01-22: 2 mg via INTRAVENOUS

## 2022-01-22 MED ORDER — PROPOFOL 500 MG/50ML IV EMUL
INTRAVENOUS | Status: DC | PRN
  Administered 2022-01-22: 150 ug/kg/min via INTRAVENOUS

## 2022-01-22 MED ORDER — EPHEDRINE SULFATE (PRESSORS) 50 MG/ML IJ SOLN
INTRAMUSCULAR | Status: DC | PRN
  Administered 2022-01-22: 15 mg via INTRAVENOUS

## 2022-01-22 MED ORDER — PHENYLEPHRINE HCL (PRESSORS) 10 MG/ML IV SOLN
INTRAVENOUS | Status: DC | PRN
  Administered 2022-01-22: 80 ug via INTRAVENOUS

## 2022-01-22 MED ORDER — KETAMINE HCL 10 MG/ML IJ SOLN
INTRAMUSCULAR | Status: DC | PRN
  Administered 2022-01-22: 10 mg via INTRAVENOUS
  Administered 2022-01-22: 5 mg via INTRAVENOUS

## 2022-01-22 MED ORDER — PROPOFOL 10 MG/ML IV BOLUS
INTRAVENOUS | Status: DC | PRN
Start: 2022-01-22 — End: 2022-01-22
  Administered 2022-01-22: 40 mg via INTRAVENOUS

## 2022-01-22 NOTE — Op Note (Signed)
[facility] Gastroenterology Patient Name: Dawn Randall Procedure Date: 01/22/2022 8:33 AM MRN: [id number] Account #: [id number] Date of Birth: 03/08/1966 Admit Type: Outpatient Age: 56 Room: [facility] ENDO ROOM 3 Gender: Female Note Status: Finalized Instrument Name: Jasper Riling [id number] Procedure:             Colonoscopy Indications:           Colon cancer screening in patient at increased risk:                         Family history of 1st-degree relative with colon polyps Providers:             Andrey Farmer MD, MD Referring MD:          [place] (Referring MD) Medicines:             Monitored Anesthesia Care Complications:         No immediate complications. Procedure:             Pre-Anesthesia Assessment:                        - Prior to the procedure, a History and Physical was                         performed, and patient medications and allergies were                         reviewed. The patient is competent. The risks and                         benefits of the procedure and the sedation options and                         risks were discussed with the patient. All questions                         were answered and informed consent was obtained.                         Patient identification and proposed procedure were                         verified by the physician, the nurse, the                         anesthesiologist, the anesthetist and the technician                         in the endoscopy suite. Mental Status Examination:                         alert and oriented. Airway Examination: normal                         oropharyngeal airway and neck mobility. Respiratory                         Examination: clear to auscultation. CV Examination:  normal. Prophylactic Antibiotics: The patient does not                         require prophylactic antibiotics. Prior                          Anticoagulants: The patient has taken no anticoagulant                         or antiplatelet agents. ASA Grade Assessment: III - A                         patient with severe systemic disease. After reviewing                         the risks and benefits, the patient was deemed in                         satisfactory condition to undergo the procedure. The                         anesthesia plan was to use monitored anesthesia care                         (MAC). Immediately prior to administration of                         medications, the patient was re-assessed for adequacy                         to receive sedatives. The heart rate, respiratory                         rate, oxygen saturations, blood pressure, adequacy of                         pulmonary ventilation, and response to care were                         monitored throughout the procedure. The physical                         status of the patient was re-assessed after the                         procedure.                        After obtaining informed consent, the colonoscope was                         passed under direct vision. Throughout the procedure,                         the patient's blood pressure, pulse, and oxygen                         saturations were monitored continuously. The  Colonoscope was introduced through the anus and                         advanced to the the cecum, identified by appendiceal                         orifice and ileocecal valve. The colonoscopy was                         somewhat difficult due to a tortuous colon. The                         patient tolerated the procedure well. The quality of                         the bowel preparation was fair. The ileocecal valve,                         appendiceal orifice, and rectum were photographed. Findings:      The perianal and digital rectal examinations were normal.      A few small-mouthed diverticula  were found in the sigmoid colon and       transverse colon.      Internal hemorrhoids were found during retroflexion. The hemorrhoids       were Grade I (internal hemorrhoids that do not prolapse).      The exam was otherwise without abnormality on direct and retroflexion       views. Impression:            - Preparation of the colon was fair.                        - Diverticulosis in the sigmoid colon and in the                         transverse colon.                        - Internal hemorrhoids.                        - The examination was otherwise normal on direct and                         retroflexion views.                        - No specimens collected. Recommendation:        - Discharge patient to home.                        - Resume previous diet.                        - Continue present medications.                        - Repeat colonoscopy in 6-12 months because the bowel                         preparation was suboptimal.                        -  Return to referring physician as previously                         scheduled. Procedure Code(s):     --- Professional ---                        Y7829, Colorectal cancer screening; colonoscopy on                         individual at high risk Diagnosis Code(s):     --- Professional ---                        Z83.71, Family history of colonic polyps                        K64.0, First degree hemorrhoids                        K57.30, Diverticulosis of large intestine without                         perforation or abscess without bleeding CPT copyright 2022 [employer]. All rights reserved. The codes documented in this report are preliminary and upon coder review may  be revised to meet current compliance requirements. Andrey Farmer MD, MD 01/22/2022 9:12:17 AM Number of Addenda: 0 Note Initiated On: 01/22/2022 8:33 AM Scope Withdrawal Time: 0 hours 5 minutes 33 seconds  Total Procedure Duration:  0 hours 16 minutes 4 seconds  Estimated Blood Loss:  Estimated blood loss: none.      [facility]

## 2022-01-22 NOTE — Anesthesia Preprocedure Evaluation (Signed)
Anesthesia Evaluation  Patient identified by MRN, date of birth, ID band Patient awake    Reviewed: Allergy & Precautions, H&P , NPO status , Patient's Chart, lab work & pertinent test results, reviewed documented beta blocker date and time   Airway Mallampati: II   Neck ROM: full    Dental  (+) Poor Dentition   Pulmonary neg pulmonary ROS   Pulmonary exam normal        Cardiovascular negative cardio ROS Normal cardiovascular exam Rhythm:regular Rate:Normal     Neuro/Psych negative neurological ROS  negative psych ROS   GI/Hepatic Neg liver ROS,GERD  Medicated,,  Endo/Other  Hypothyroidism    Renal/GU negative Renal ROS  negative genitourinary   Musculoskeletal   Abdominal   Peds  Hematology negative hematology ROS (+)   Anesthesia Other Findings Past Medical History: No date: GERD (gastroesophageal reflux disease) No date: Hypothyroidism Past Surgical History: No date: CESAREAN SECTION No date: OVARY BIOPSY 09/09/2014: THYROIDECTOMY; N/A     Comment:  Procedure: TOTAL THYROIDECTOMY, left superior               parathyroid gland reimplanted;  Surgeon: Bud Face, MD;  Location: [facility] ORS;  Service: ENT;                Laterality: N/A; No date: TUBAL LIGATION BMI    Body Mass Index: 24.03 kg/m     Reproductive/Obstetrics negative OB ROS                             Anesthesia Physical Anesthesia Plan  ASA: 3  Anesthesia Plan: General   Post-op Pain Management:    Induction:   PONV Risk Score and Plan:   Airway Management Planned:   Additional Equipment:   Intra-op Plan:   Post-operative Plan:   Informed Consent: I have reviewed the patients History and Physical, chart, labs and discussed the procedure including the risks, benefits and alternatives for the proposed anesthesia with the patient or authorized representative who has indicated his/her  understanding and acceptance.     Dental Advisory Given  Plan Discussed with: CRNA  Anesthesia Plan Comments:        Anesthesia Quick Evaluation

## 2022-01-22 NOTE — Interval H&P Note (Signed)
History and Physical Interval Note:  01/22/2022 8:44 AM  Dawn Randall  has presented today for surgery, with the diagnosis of family history colon polyps.  The various methods of treatment have been discussed with the patient and family. After consideration of risks, benefits and other options for treatment, the patient has consented to  Procedure(s): COLONOSCOPY WITH PROPOFOL (N/A) as a surgical intervention.  The patient's history has been reviewed, patient examined, no change in status, stable for surgery.  I have reviewed the patient's chart and labs.  Questions were answered to the patient's satisfaction.     Regis Bill  Ok to proceed with colonoscopy

## 2022-01-22 NOTE — Transfer of Care (Signed)
Immediate Anesthesia Transfer of Care Note  Patient: Dawn Randall  Procedure(s) Performed: COLONOSCOPY WITH PROPOFOL  Patient Location: PACU  Anesthesia Type:MAC  Level of Consciousness: drowsy  Airway & Oxygen Therapy: Patient Spontanous Breathing  Post-op Assessment: Report given to RN and Post -op Vital signs reviewed and stable  Post vital signs: Reviewed and stable  Last Vitals:  Vitals Value Taken Time  BP 86/54 01/22/22 0912  Temp    Pulse 72 01/22/22 0914  Resp 20 01/22/22 0914  SpO2 98 % 01/22/22 0914  Vitals shown include unvalidated device data.  Last Pain:  Vitals:   01/22/22 0912  TempSrc:   PainSc: 0-No pain         Complications: No notable events documented.

## 2022-01-22 NOTE — H&P (Signed)
Outpatient short stay form Pre-procedure 01/22/2022  Regis Bill, MD  Primary Physician: [facility]  Reason for visit:  Screening  History of present illness:    56 y/o lady with history of hypothyroidism and partial deafness here for screening colonoscopy. Last colonoscopy 5 years ago was unremarkable besides redundant colon. No blood thinners. No family history of GI malignancies. No significant abdominal surgeries.    Current Facility-Administered Medications:    0.9 %  sodium chloride infusion, , Intravenous, Continuous, Lyonel Morejon, Rossie Muskrat, MD, Last Rate: 20 mL/hr at 01/22/22 0816, 20 mL/hr at 01/22/22 0816  Medications Prior to Admission  Medication Sig Dispense Refill Last Dose   levothyroxine (SYNTHROID, LEVOTHROID) 100 MCG tablet Take 1 tablet (100 mcg total) by mouth daily before breakfast. 30 tablet 11 01/21/2022   pregabalin (LYRICA) 50 MG capsule Take 100 mg by mouth at bedtime.   01/21/2022     Allergies  Allergen Reactions   Sulfa Antibiotics Anaphylaxis, Other (See Comments) and Shortness Of Breath    Other Reaction: Difficulty breathing    Tape Hives, Rash and Other (See Comments)    Other Reaction: urticaria   Codeine Hives and Itching   Cyclobenzaprine Other (See Comments)   Morphine Itching   Morphine And Related Nausea And Vomiting   Penicillins Hives and Other (See Comments)    Other reaction(s): Unknown, Unknown (comments)   Zoster Vaccine Live Other (See Comments)     Past Medical History:  Diagnosis Date   GERD (gastroesophageal reflux disease)    Hypothyroidism     Review of systems:  Otherwise negative.    Physical Exam  Gen: Alert, oriented. Appears stated age.  HEENT: PERRLA. Lungs: No respiratory distress CV: RRR Abd: soft, benign, no masses Ext: No edema    Planned procedures: Proceed with colonoscopy. The patient understands the nature of the planned procedure, indications, risks,  alternatives and potential complications including but not limited to bleeding, infection, perforation, damage to internal organs and possible oversedation/side effects from anesthesia. The patient agrees and gives consent to proceed.  Please refer to procedure notes for findings, recommendations and patient disposition/instructions.     Regis Bill, MD [facility] Gastroenterology

## 2022-01-22 NOTE — Anesthesia Postprocedure Evaluation (Signed)
Anesthesia Post Note  Patient: DORMA ALTMAN  Procedure(s) Performed: COLONOSCOPY WITH PROPOFOL  Patient location during evaluation: PACU Anesthesia Type: General Level of consciousness: awake and alert Pain management: pain level controlled Vital Signs Assessment: post-procedure vital signs reviewed and stable Respiratory status: spontaneous breathing, nonlabored ventilation, respiratory function stable and patient connected to nasal cannula oxygen Cardiovascular status: blood pressure returned to baseline and stable Postop Assessment: no apparent nausea or vomiting Anesthetic complications: no   No notable events documented.   Last Vitals:  Vitals:   01/22/22 0801 01/22/22 0912  BP: 117/68 (!) 86/54  Pulse: 79 70  Resp: 20 16  Temp: (!) 36.1 C   SpO2: 100% 98%    Last Pain:  Vitals:   01/22/22 0922  TempSrc:   PainSc: 0-No pain                 Yevette Edwards

## 2022-01-23 ENCOUNTER — Encounter: Payer: Self-pay | Admitting: Gastroenterology

## 2022-09-14 IMAGING — MR MR CERVICAL SPINE WO/W CM
7 of 9 series · 31 of 48 positions shown · IV contrast (14 mL Multihance)
Comparison: 02/28/2010

CLINICAL DATA: Pain in posterior neck, pain in right leg and
numbness in right foot, pain and needle sensation all over

EXAM:
MRI CERVICAL SPINE WITHOUT AND WITH CONTRAST
TECHNIQUE: Multiplanar and multiecho pulse sequences of the cervical spine, to
include the craniocervical junction and cervicothoracic junction,
were obtained without and with intravenous contrast.
CONTRAST:  14mL MULTIHANCE GADOBENATE DIMEGLUMINE 529 MG/ML IV SOLN

[Series 7: T1 · sagittal · 3.0mm · 0.66mm/px · 4 of 17 slices shown (1 of 3)]
[im 1/17]
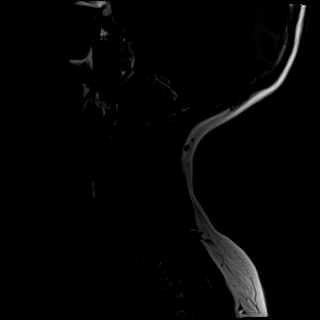
[im 6/17]
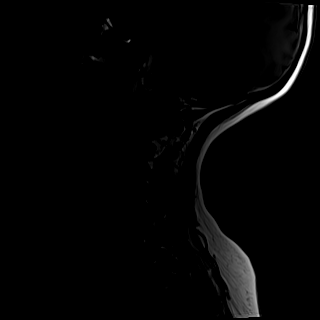
[im 11/17]
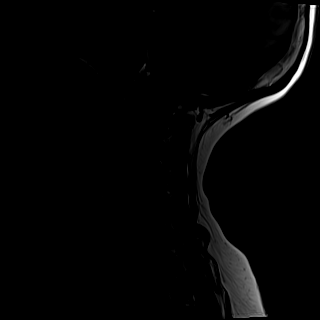
[im 17/17]
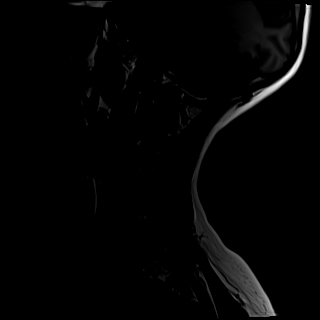

[Series 8: T2 · sagittal · 3.0mm · 0.55mm/px · 4 of 17 slices shown (1 of 3)]
[im 1/17]
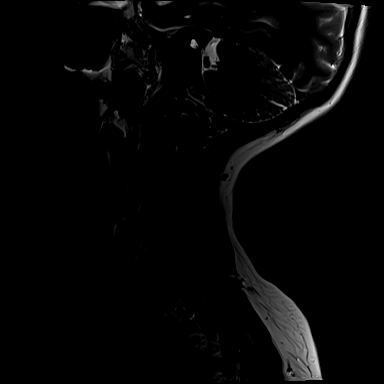
[im 6/17]
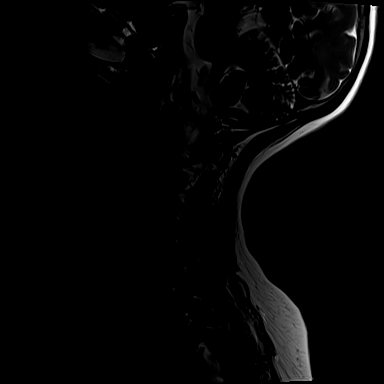
[im 11/17]
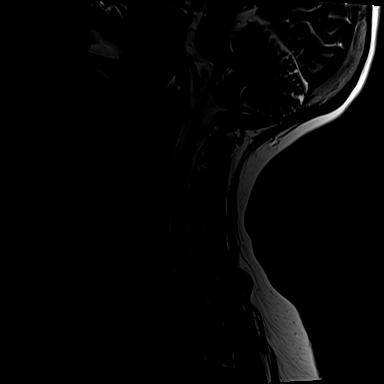
[im 17/17]
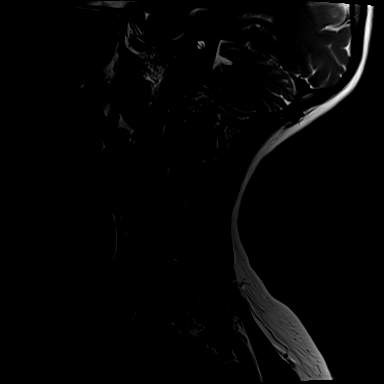

[Series 10: T2 · axial · 3.0mm · 0.50mm/px · z∈[-206,-113]mm · 7 of 31 slices shown (2 of 3)]
[im 1/31]
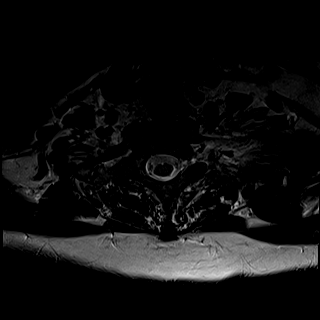
[im 6/31]
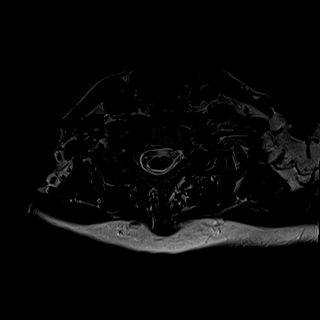
[im 11/31]
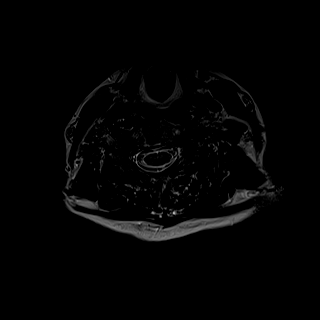
[im 16/31]
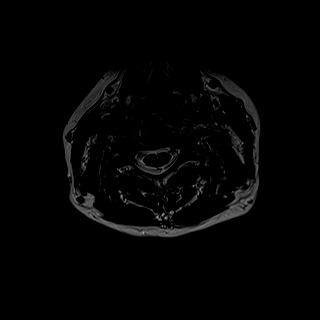
[im 21/31]
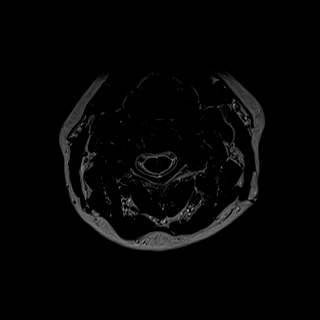
[im 26/31]
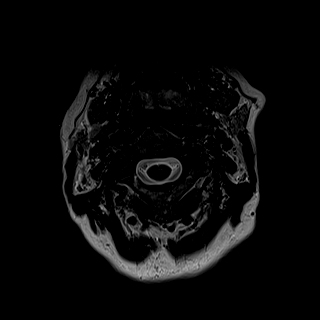
[im 31/31]
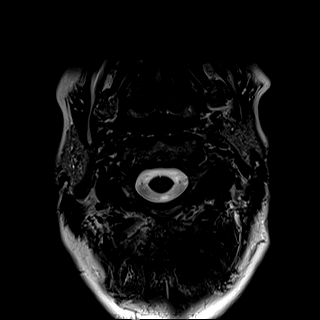

[Series 12: T1 · axial · non-contrast · 3.0mm · 0.31mm/px · z∈[-208,-112]mm · 7 of 32 slices shown (2 of 3)]
[im 1/32]
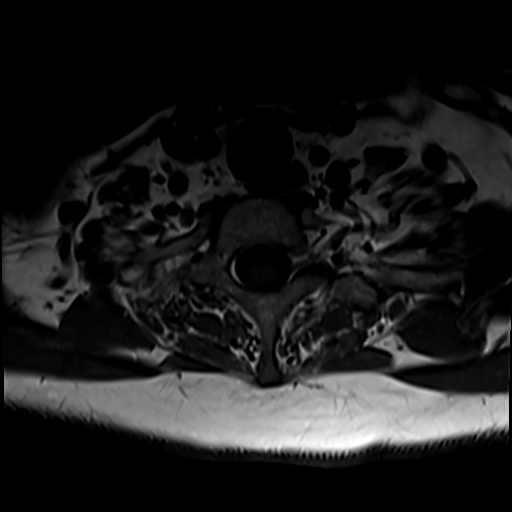
[im 6/32]
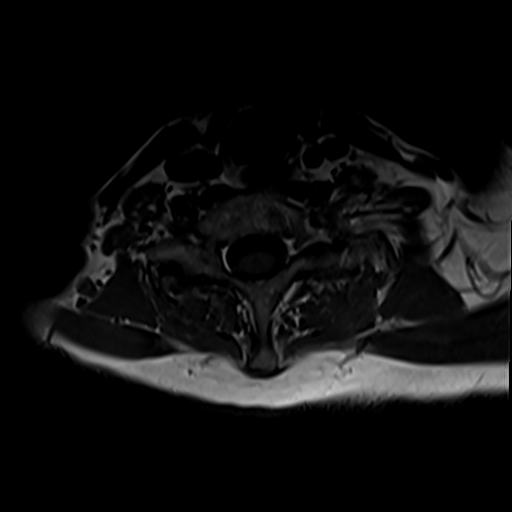
[im 11/32]
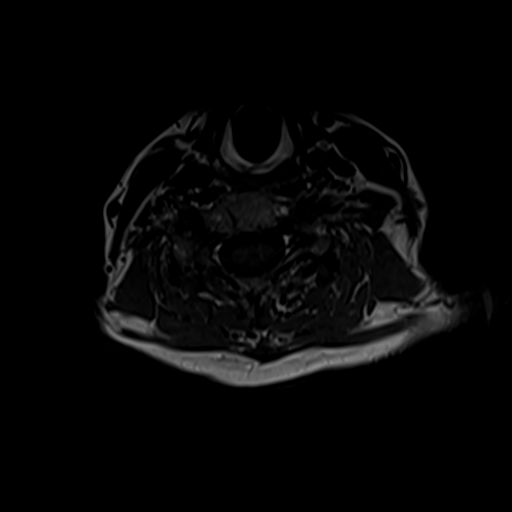
[im 16/32]
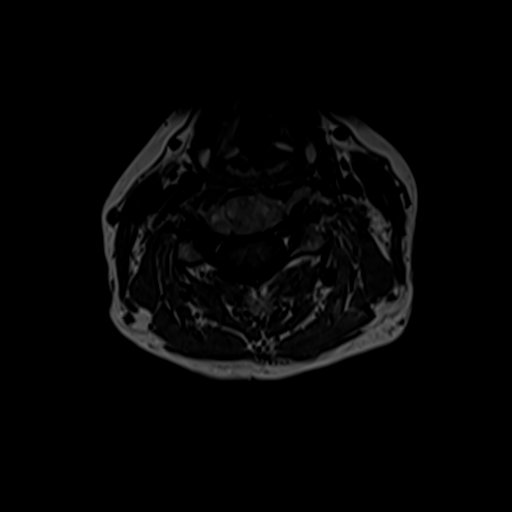
[im 21/32]
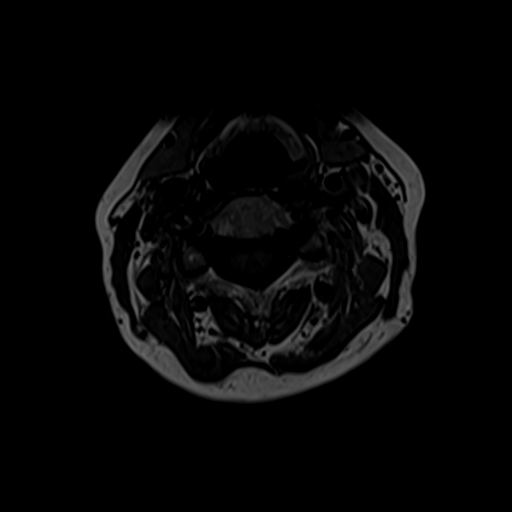
[im 26/32]
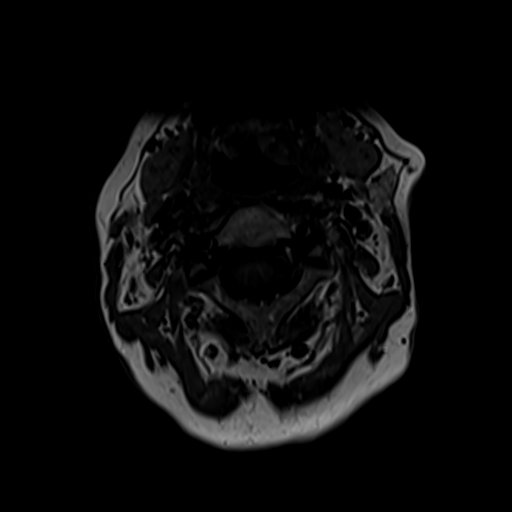
[im 32/32]
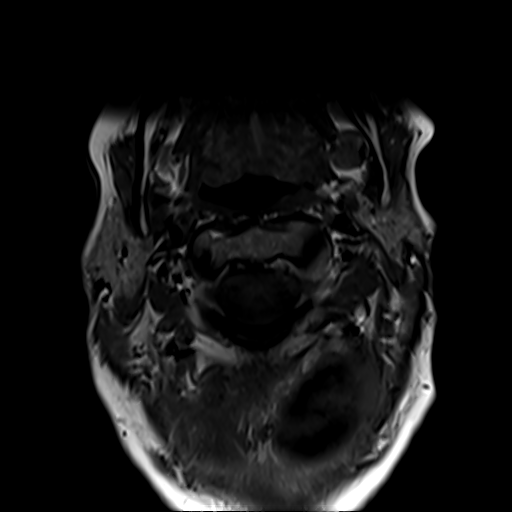

[Series 13: T2 · sagittal · 3.0mm · 0.55mm/px · 4 of 17 slices shown (3 of 3)]
[im 1/17]
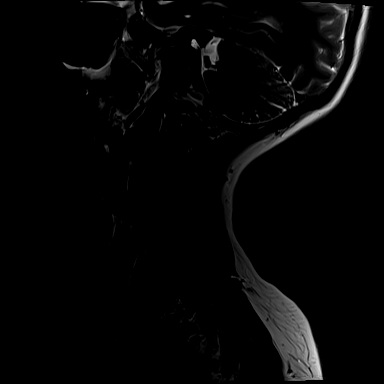
[im 6/17]
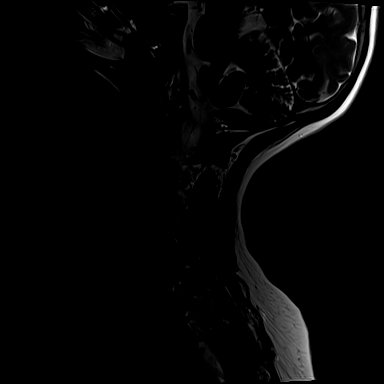
[im 11/17]
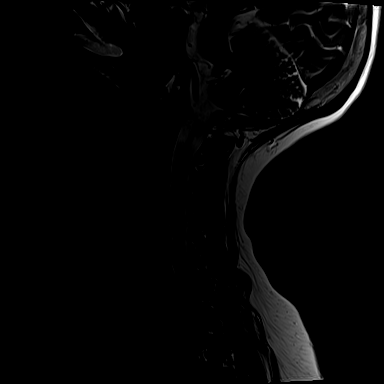
[im 17/17]
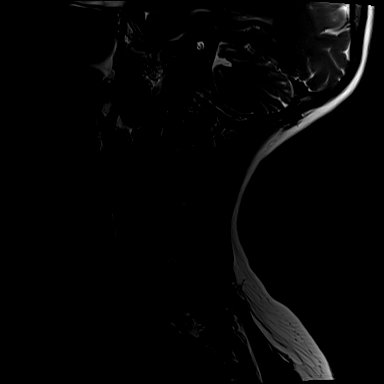

[Series 14: T1 fat-sat post-contrast · sagittal · 3.0mm · 0.66mm/px · 4 of 17 slices shown]
[im 1/17]
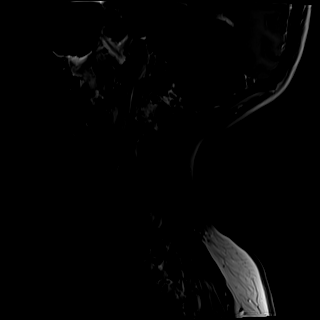
[im 6/17]
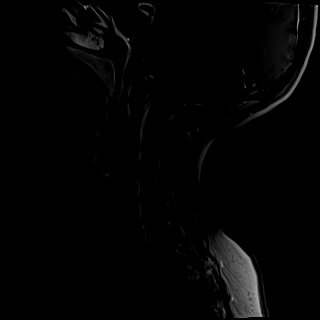
[im 11/17]
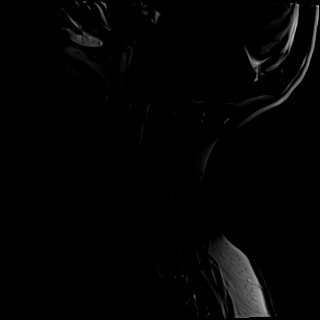
[im 17/17]
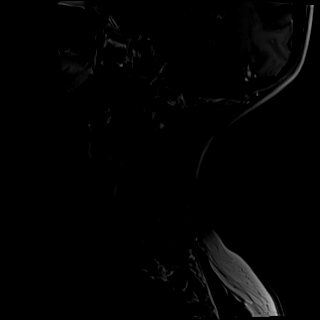

[Series 15: T1 · axial · 3.0mm · 0.31mm/px · 1 of 32 slices shown (3 of 3)]
[im 1/32]
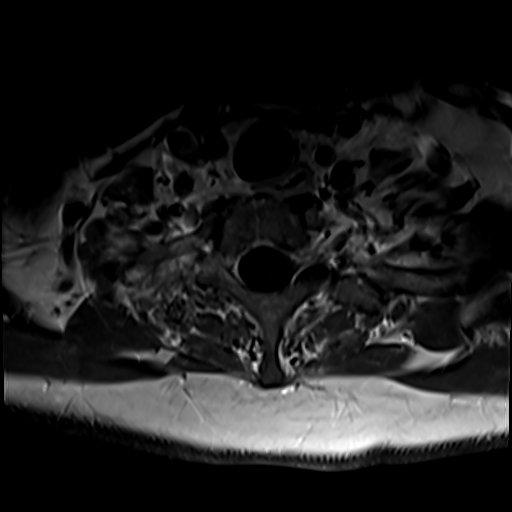

[31 of 48 positions shown; findings below may reference images not displayed]

FINDINGS: Alignment: No listhesis.

Vertebrae: No acute fracture or suspicious osseous lesion. No
abnormal enhancement.

Cord: Normal signal and morphology.  No abnormal enhancement.

Posterior Fossa, vertebral arteries, paraspinal tissues: Negative.

Disc levels:

C2-C3: No significant disc bulge. No spinal canal stenosis or
neuroforaminal narrowing.

C3-C4: No significant disc bulge. No spinal canal stenosis or
neuroforaminal narrowing.

C4-C5: No significant disc bulge. No spinal canal stenosis or
neuroforaminal narrowing.

C5-C6: Small right subarticular disc protrusion. No spinal canal
stenosis or neural foraminal narrowing.

C6-C7: Minimal disc bulge. No spinal canal stenosis or neural
foraminal narrowing.

C7-T1: No significant disc bulge. No spinal canal stenosis or
neuroforaminal narrowing.
IMPRESSION: Mild degenerative changes, without spinal canal stenosis or neural
foraminal narrowing. No abnormal cord signal. No abnormal
enhancement.

## 2022-09-14 IMAGING — MR MR HEAD WO/W CM
13 series · 48 of 48 positions shown · IV contrast (multihance)
Comparison: 02/28/2010

CLINICAL DATA: Pins and needle sensation all over, pain in right
leg and numbness in right foot

EXAM:
MRI HEAD WITHOUT AND WITH CONTRAST
TECHNIQUE: Multiplanar, multiecho pulse sequences of the brain and surrounding
structures were obtained without and with intravenous contrast.
CONTRAST:  14mL MULTIHANCE GADOBENATE DIMEGLUMINE 529 MG/ML IV SOLN

[Series 5: T1 · sagittal · 4.0mm · 0.75mm/px · 2 of 29 slices shown (1 of 3)]
[im 1/29]
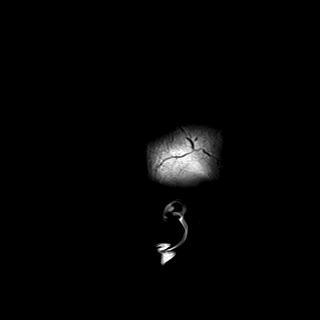
[im 29/29]
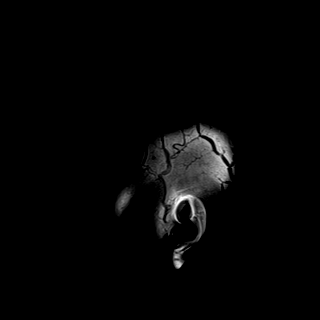

[Series 6: DWI · axial · 3.0mm · 0.94mm/px · z∈[-54,+92]mm · 9 of 168 slices shown (1 of 3)]
[im 1/168]
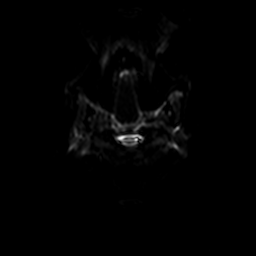
[im 21/168]
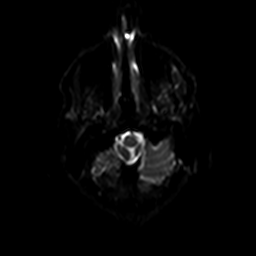
[im 42/168]
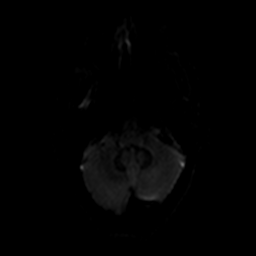
[im 63/168]
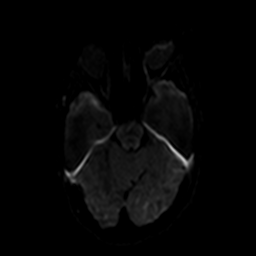
[im 84/168]
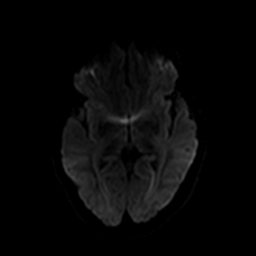
[im 105/168]
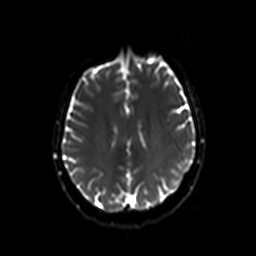
[im 126/168]
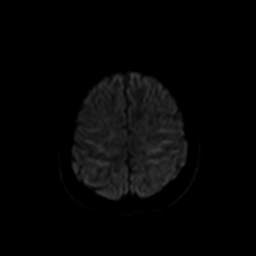
[im 147/168]
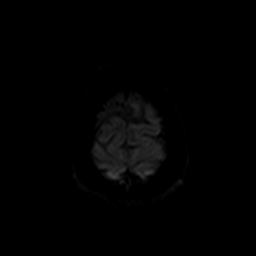
[im 168/168]
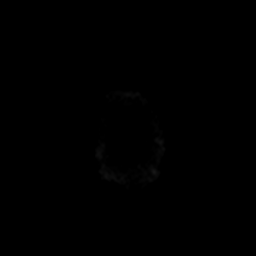

[Series 7: ax dwi_tracew · axial · 3.0mm · 0.94mm/px · z∈[-54,+92]mm · 4 of 84 slices shown]
[im 1/84]
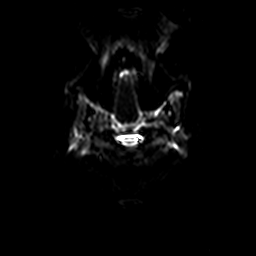
[im 28/84]
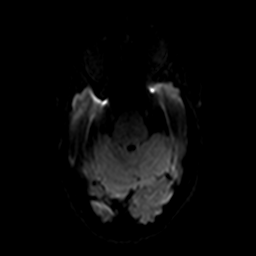
[im 56/84]
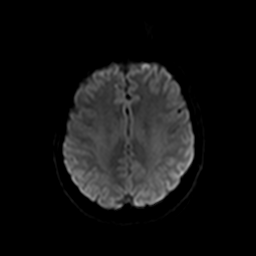
[im 84/84]
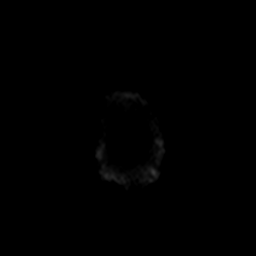

[Series 8: ax dwi_adc · axial · 3.0mm · 0.94mm/px · z∈[-54,+92]mm · 2 of 41 slices shown]
[im 1/41]
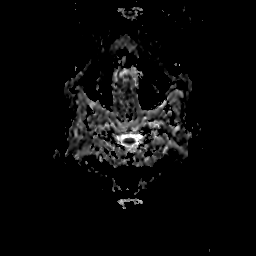
[im 41/41]
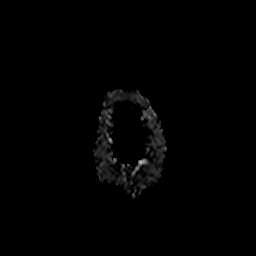

[Series 9: DWI · coronal · 5.0mm · 1.44mm/px · 3 of 66 slices shown (2 of 3)]
[im 1/66]
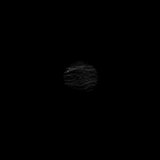
[im 33/66]
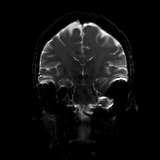
[im 66/66]
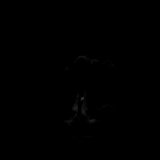

[Series 10: DWI · coronal · 5.0mm · 1.44mm/px · 2 of 33 slices shown (3 of 3)]
[im 1/33]
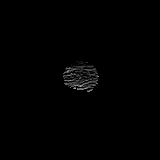
[im 33/33]
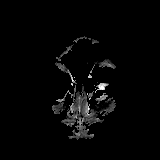

[Series 11: T2 · axial · 4.0mm · 0.36mm/px · z∈[-59,+90]mm · 2 of 30 slices shown]
[im 1/30]
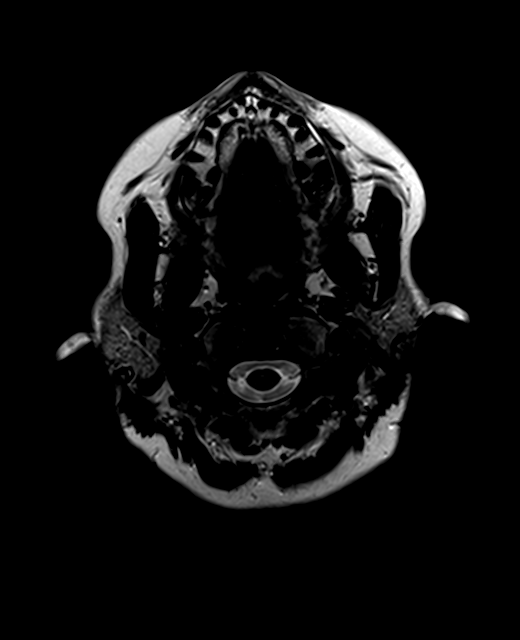
[im 30/30]
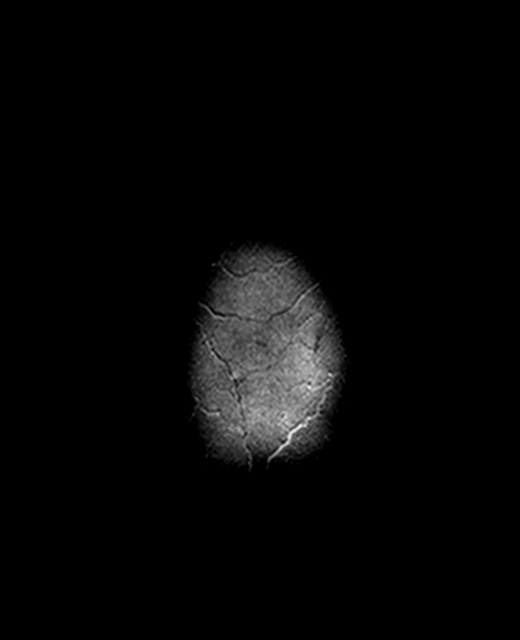

[Series 12: FLAIR · axial · 3.0mm · 0.72mm/px · 1 of 26 slices shown]
[im 1/26]
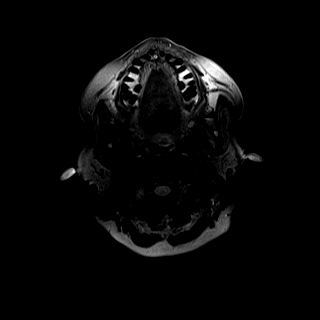

[Series 14: swi_images · axial · 3.0mm · 0.90mm/px · z∈[-60,+91]mm · 3 of 52 slices shown]
[im 1/52]
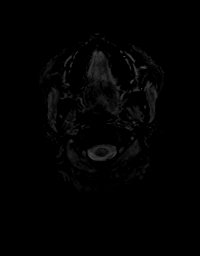
[im 26/52]
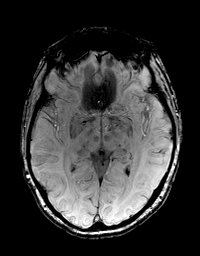
[im 52/52]
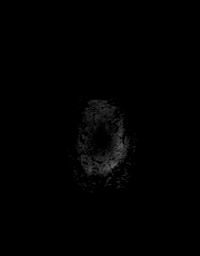

[Series 15: T1 · axial · 1.0mm · 0.90mm/px · z∈[-69,+88]mm · 8 of 160 slices shown (2 of 3)]
[im 1/160]
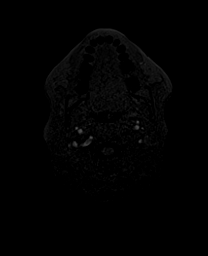
[im 23/160]
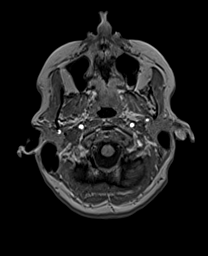
[im 46/160]
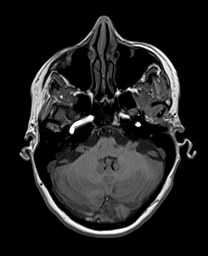
[im 69/160]
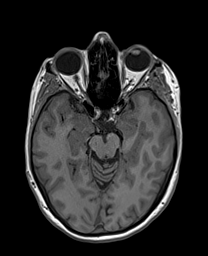
[im 91/160]
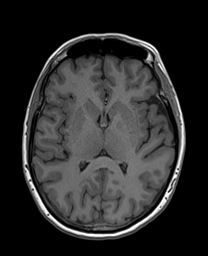
[im 114/160]
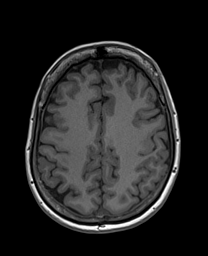
[im 137/160]
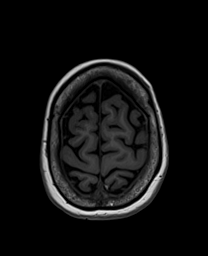
[im 160/160]
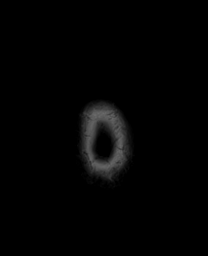

[Series 16: T2 post-contrast · coronal · 4.0mm · 0.36mm/px · 2 of 35 slices shown]
[im 1/35]
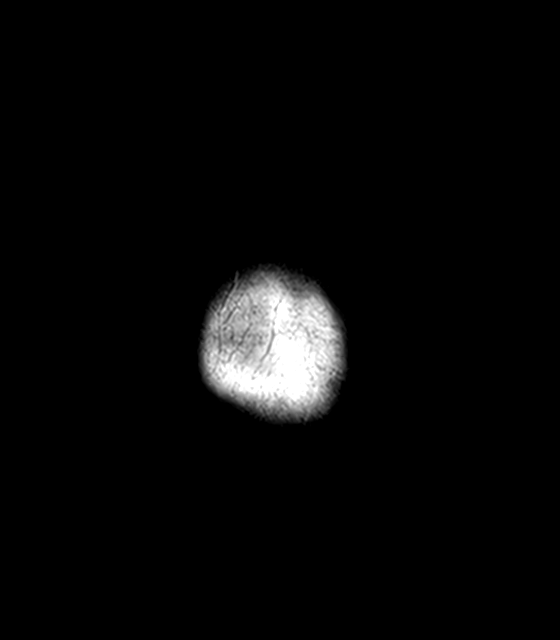
[im 35/35]
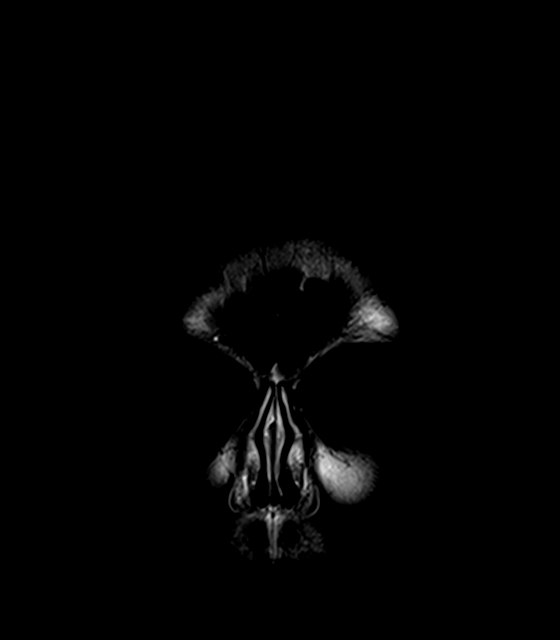

[Series 18: T1 post-contrast · coronal · 4.0mm · 0.72mm/px · 2 of 35 slices shown]
[im 1/35]
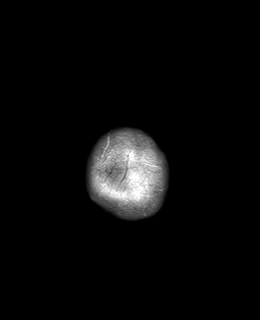
[im 35/35]
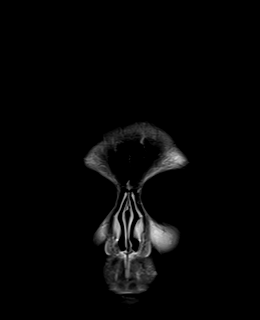

[Series 19: T1 · axial · 1.0mm · 0.94mm/px · z∈[-70,+88]mm · 8 of 160 slices shown (3 of 3)]
[im 1/160]
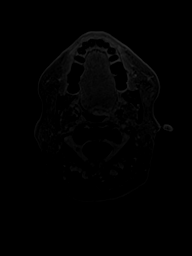
[im 23/160]
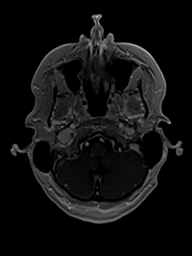
[im 46/160]
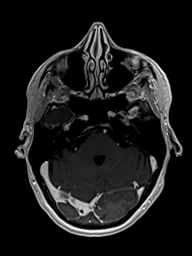
[im 69/160]
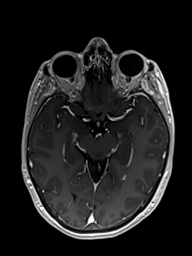
[im 91/160]
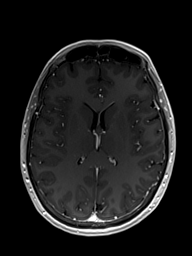
[im 114/160]
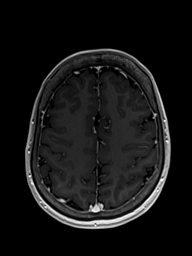
[im 137/160]
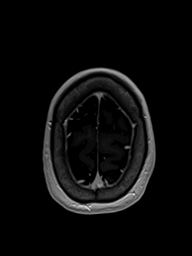
[im 160/160]
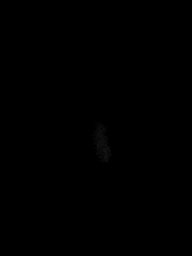

[48 of 48 positions shown; findings below may reference images not displayed]

FINDINGS: Brain: No restricted diffusion to suggest acute or subacute infarct.
No acute hemorrhage, mass, mass effect, or midline shift. No
hydrocephalus or extra-axial collection. No abnormal parenchymal or
meningeal enhancement. No evidence of remote cortical or lacunar
infarct. No T2 hyperintense signal in the periventricular white
matter to suggest chronic small vessel ischemic disease or
demyelinating disease. Cerebral volume is within normal limits.

Vascular: Normal arterial flow voids and postcontrast appearance of
the arteries. Patent venous sinuses.

Skull and upper cervical spine: Normal marrow signal.

Sinuses/Orbits: Mild mucosal thickening in the ethmoid air cells.
Otherwise unremarkable.

Other: The mastoids are well aerated.
IMPRESSION: No acute intracranial process.  Normal MRI of the brain.

## 2023-08-06 ENCOUNTER — Encounter: Payer: Self-pay | Admitting: *Deleted

## 2023-08-30 ENCOUNTER — Ambulatory Visit: Admission: RE | Admit: 2023-08-30 | Source: Home / Self Care

## 2023-08-30 SURGERY — COLONOSCOPY
Anesthesia: General

## 2024-01-08 ENCOUNTER — Other Ambulatory Visit: Payer: Self-pay | Admitting: Gastroenterology

## 2024-01-08 DIAGNOSIS — R937 Abnormal findings on diagnostic imaging of other parts of musculoskeletal system: Secondary | ICD-10-CM

## 2024-01-13 ENCOUNTER — Other Ambulatory Visit: Payer: Self-pay | Admitting: Gastroenterology

## 2024-01-13 DIAGNOSIS — R937 Abnormal findings on diagnostic imaging of other parts of musculoskeletal system: Secondary | ICD-10-CM

## 2024-01-14 ENCOUNTER — Ambulatory Visit

## 2024-01-21 ENCOUNTER — Other Ambulatory Visit

## 2024-01-28 ENCOUNTER — Other Ambulatory Visit

## 2024-03-10 ENCOUNTER — Ambulatory Visit
Admission: RE | Admit: 2024-03-10 | Discharge: 2024-03-10 | Disposition: A | Discharge status: Home / Self Care | Source: Ambulatory Visit | Attending: Gastroenterology | Admitting: Gastroenterology

## 2024-03-10 DIAGNOSIS — R937 Abnormal findings on diagnostic imaging of other parts of musculoskeletal system: Secondary | ICD-10-CM

## 2024-04-14 ENCOUNTER — Other Ambulatory Visit: Payer: Self-pay

## 2024-04-14 ENCOUNTER — Emergency Department
Admission: EM | Admit: 2024-04-14 | Discharge: 2024-04-15 | Disposition: A | Attending: Emergency Medicine | Admitting: Emergency Medicine

## 2024-04-14 ENCOUNTER — Encounter: Payer: Self-pay | Admitting: Emergency Medicine

## 2024-04-14 DIAGNOSIS — E039 Hypothyroidism, unspecified: Secondary | ICD-10-CM | POA: Insufficient documentation

## 2024-04-14 DIAGNOSIS — G44319 Acute post-traumatic headache, not intractable: Secondary | ICD-10-CM

## 2024-04-14 DIAGNOSIS — M542 Cervicalgia: Secondary | ICD-10-CM | POA: Insufficient documentation

## 2024-04-14 DIAGNOSIS — M25512 Pain in left shoulder: Secondary | ICD-10-CM | POA: Insufficient documentation

## 2024-04-14 DIAGNOSIS — S060XAA Concussion with loss of consciousness status unknown, initial encounter: Secondary | ICD-10-CM | POA: Insufficient documentation

## 2024-04-14 DIAGNOSIS — R000 Tachycardia, unspecified: Secondary | ICD-10-CM | POA: Insufficient documentation

## 2024-04-14 DIAGNOSIS — M549 Dorsalgia, unspecified: Secondary | ICD-10-CM | POA: Insufficient documentation

## 2024-04-14 DIAGNOSIS — Y92410 Unspecified street and highway as the place of occurrence of the external cause: Secondary | ICD-10-CM | POA: Insufficient documentation

## 2024-04-14 LAB — CBC WITH DIFFERENTIAL/PLATELET
Abs Immature Granulocytes: 0.03 10*3/uL (ref 0.00–0.07)
Basophils Absolute: 0.1 10*3/uL (ref 0.0–0.1)
Basophils Relative: 1 %
Eosinophils Absolute: 0 10*3/uL (ref 0.0–0.5)
Eosinophils Relative: 1 %
HCT: 39.2 % (ref 36.0–46.0)
Hemoglobin: 13.1 g/dL (ref 12.0–15.0)
Immature Granulocytes: 1 %
Lymphocytes Relative: 23 %
Lymphs Abs: 1.5 10*3/uL (ref 0.7–4.0)
MCH: 31.9 pg (ref 26.0–34.0)
MCHC: 33.4 g/dL (ref 30.0–36.0)
MCV: 95.4 fL (ref 80.0–100.0)
Monocytes Absolute: 0.7 10*3/uL (ref 0.1–1.0)
Monocytes Relative: 11 %
Neutro Abs: 4.2 10*3/uL (ref 1.7–7.7)
Neutrophils Relative %: 63 %
Platelets: 141 10*3/uL — ABNORMAL LOW (ref 150–400)
RBC: 4.11 MIL/uL (ref 3.87–5.11)
RDW: 12.4 % (ref 11.5–15.5)
WBC: 6.5 10*3/uL (ref 4.0–10.5)
nRBC: 0 % (ref 0.0–0.2)

## 2024-04-14 LAB — BASIC METABOLIC PANEL WITH GFR
Anion gap: 12 (ref 5–15)
BUN: 17 mg/dL (ref 6–20)
CO2: 25 mmol/L (ref 22–32)
Calcium: 9.4 mg/dL (ref 8.9–10.3)
Chloride: 105 mmol/L (ref 98–111)
Creatinine, Ser: 0.85 mg/dL (ref 0.44–1.00)
GFR, Estimated: >60 mL/min
Glucose, Bld: 151 mg/dL — ABNORMAL HIGH (ref 70–99)
Potassium: 3.4 mmol/L — ABNORMAL LOW (ref 3.5–5.1)
Sodium: 141 mmol/L (ref 135–145)

## 2024-04-14 LAB — HEPATIC FUNCTION PANEL
ALT: 50 U/L — ABNORMAL HIGH (ref 0–44)
AST: 45 U/L — ABNORMAL HIGH (ref 15–41)
Albumin: 4.1 g/dL (ref 3.5–5.0)
Alkaline Phosphatase: 95 U/L (ref 38–126)
Bilirubin, Direct: 0.1 mg/dL (ref 0.0–0.2)
Indirect Bilirubin: 0.2 mg/dL — ABNORMAL LOW (ref 0.3–0.9)
Total Bilirubin: 0.3 mg/dL (ref 0.0–1.2)
Total Protein: 6.5 g/dL (ref 6.5–8.1)

## 2024-04-14 LAB — LIPASE, BLOOD: Lipase: 59 U/L — ABNORMAL HIGH (ref 11–51)

## 2024-04-14 MED ORDER — HYDROMORPHONE HCL 1 MG/ML IJ SOLN
0.5000 mg | Freq: Once | INTRAMUSCULAR | Status: AC
  Administered 2024-04-15: 0.5 mg via INTRAVENOUS
  Filled 2024-04-14: qty 0.5

## 2024-04-14 MED ORDER — HYDROMORPHONE HCL 1 MG/ML IJ SOLN
0.2000 mg | Freq: Once | INTRAMUSCULAR | Status: AC
  Administered 2024-04-14: 0.2 mg via INTRAVENOUS
  Filled 2024-04-14: qty 0.5

## 2024-04-14 MED ORDER — SODIUM CHLORIDE 0.9 % IV BOLUS
1000.0000 mL | Freq: Once | INTRAVENOUS | Status: AC
  Administered 2024-04-14: 1000 mL via INTRAVENOUS

## 2024-04-14 MED ORDER — ONDANSETRON HCL 4 MG/2ML IJ SOLN
4.0000 mg | Freq: Once | INTRAMUSCULAR | Status: AC
  Administered 2024-04-14: 4 mg via INTRAVENOUS
  Filled 2024-04-14: qty 2

## 2024-04-14 MED ORDER — DIPHENHYDRAMINE HCL 50 MG/ML IJ SOLN
6.5000 mg | Freq: Once | INTRAMUSCULAR | Status: AC
  Administered 2024-04-14: 6.5 mg via INTRAVENOUS
  Filled 2024-04-14: qty 1
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
## 2024-04-15 ENCOUNTER — Emergency Department

## 2024-04-15 LAB — TYPE AND SCREEN
ABO/RH(D): O POS
Antibody Screen: NEGATIVE

## 2024-04-15 MED ORDER — OXYCODONE-ACETAMINOPHEN 5-325 MG PO TABS: 1.0000 | ORAL_TABLET | ORAL | 0 refills | Status: AC | PRN

## 2024-04-15 MED ORDER — OXYCODONE-ACETAMINOPHEN 5-325 MG PO TABS
1.0000 | ORAL_TABLET | Freq: Once | ORAL | Status: AC
  Administered 2024-04-15: 1 via ORAL
  Filled 2024-04-15: qty 1

## 2024-04-15 MED ORDER — IOHEXOL 300 MG/ML  SOLN
100.0000 mL | Freq: Once | INTRAMUSCULAR | Status: AC | PRN
  Administered 2024-04-15: 100 mL via INTRAVENOUS
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
